# Patient Record
Sex: Male | Born: 1970 | Race: White | Hispanic: No | Marital: Married | Smoking: Former smoker
Health system: Southern US, Community
[De-identification: ages and names within clinical notes are randomized; demographics above are authoritative.]

## PROBLEM LIST (undated history)

## (undated) DIAGNOSIS — M722 Plantar fascial fibromatosis: Secondary | ICD-10-CM

## (undated) DIAGNOSIS — Z889 Allergy status to unspecified drugs, medicaments and biological substances status: Secondary | ICD-10-CM

## (undated) DIAGNOSIS — K5790 Diverticulosis of intestine, part unspecified, without perforation or abscess without bleeding: Secondary | ICD-10-CM

---

## 2002-07-13 ENCOUNTER — Emergency Department (HOSPITAL_COMMUNITY): Admission: EM | Admit: 2002-07-13 | Discharge: 2002-07-13 | Payer: Self-pay | Admitting: Emergency Medicine

## 2002-07-13 ENCOUNTER — Encounter: Payer: Self-pay | Admitting: Emergency Medicine

## 2014-12-30 ENCOUNTER — Other Ambulatory Visit: Payer: Self-pay | Admitting: Gastroenterology

## 2014-12-30 DIAGNOSIS — R1011 Right upper quadrant pain: Secondary | ICD-10-CM

## 2015-01-06 ENCOUNTER — Ambulatory Visit
Admission: RE | Admit: 2015-01-06 | Discharge: 2015-01-06 | Disposition: A | Discharge status: Home / Self Care | Payer: BLUE CROSS/BLUE SHIELD | Source: Ambulatory Visit | Attending: Gastroenterology | Admitting: Gastroenterology

## 2015-01-06 DIAGNOSIS — R1011 Right upper quadrant pain: Secondary | ICD-10-CM

## 2015-07-20 ENCOUNTER — Other Ambulatory Visit: Payer: Self-pay | Admitting: Gastroenterology

## 2015-07-20 ENCOUNTER — Other Ambulatory Visit (HOSPITAL_COMMUNITY): Payer: Self-pay | Admitting: Gastroenterology

## 2015-07-20 DIAGNOSIS — K5780 Diverticulitis of intestine, part unspecified, with perforation and abscess without bleeding: Secondary | ICD-10-CM

## 2015-07-20 DIAGNOSIS — R1032 Left lower quadrant pain: Secondary | ICD-10-CM

## 2015-07-22 ENCOUNTER — Ambulatory Visit (HOSPITAL_COMMUNITY): Payer: BLUE CROSS/BLUE SHIELD

## 2015-07-24 ENCOUNTER — Other Ambulatory Visit: Payer: BLUE CROSS/BLUE SHIELD

## 2016-05-03 ENCOUNTER — Observation Stay (HOSPITAL_COMMUNITY): Payer: BLUE CROSS/BLUE SHIELD | Admitting: Anesthesiology

## 2016-05-03 ENCOUNTER — Observation Stay (HOSPITAL_COMMUNITY)
Admission: EM | Admit: 2016-05-03 | Discharge: 2016-05-04 | Disposition: A | Discharge status: Home / Self Care | Payer: BLUE CROSS/BLUE SHIELD | Attending: General Surgery | Admitting: General Surgery

## 2016-05-03 ENCOUNTER — Emergency Department (HOSPITAL_COMMUNITY): Payer: BLUE CROSS/BLUE SHIELD

## 2016-05-03 ENCOUNTER — Encounter (HOSPITAL_COMMUNITY): Payer: Self-pay | Admitting: Emergency Medicine

## 2016-05-03 ENCOUNTER — Encounter (HOSPITAL_COMMUNITY)
Admission: EM | Disposition: A | Discharge status: Home / Self Care | Payer: Self-pay | Source: Home / Self Care | Attending: Emergency Medicine

## 2016-05-03 DIAGNOSIS — K3580 Unspecified acute appendicitis: Secondary | ICD-10-CM

## 2016-05-03 DIAGNOSIS — Z791 Long term (current) use of non-steroidal anti-inflammatories (NSAID): Secondary | ICD-10-CM | POA: Diagnosis not present

## 2016-05-03 DIAGNOSIS — Z79899 Other long term (current) drug therapy: Secondary | ICD-10-CM | POA: Insufficient documentation

## 2016-05-03 DIAGNOSIS — R109 Unspecified abdominal pain: Secondary | ICD-10-CM | POA: Diagnosis present

## 2016-05-03 DIAGNOSIS — K353 Acute appendicitis with localized peritonitis: Principal | ICD-10-CM | POA: Insufficient documentation

## 2016-05-03 HISTORY — DX: Plantar fascial fibromatosis: M72.2

## 2016-05-03 HISTORY — DX: Diverticulosis of intestine, part unspecified, without perforation or abscess without bleeding: K57.90

## 2016-05-03 HISTORY — DX: Allergy status to unspecified drugs, medicaments and biological substances: Z88.9

## 2016-05-03 HISTORY — PX: LAPAROSCOPIC APPENDECTOMY: SHX408

## 2016-05-03 LAB — LIPASE, BLOOD: LIPASE: 26 U/L (ref 11–51)

## 2016-05-03 LAB — URINALYSIS, ROUTINE W REFLEX MICROSCOPIC
BILIRUBIN URINE: NEGATIVE
Glucose, UA: NEGATIVE mg/dL
Hgb urine dipstick: NEGATIVE
Ketones, ur: NEGATIVE mg/dL
Leukocytes, UA: NEGATIVE
NITRITE: NEGATIVE
PH: 7 (ref 5.0–8.0)
Protein, ur: NEGATIVE mg/dL
SPECIFIC GRAVITY, URINE: 1.019 (ref 1.005–1.030)

## 2016-05-03 LAB — COMPREHENSIVE METABOLIC PANEL
ALT: 36 U/L (ref 17–63)
ANION GAP: 10 (ref 5–15)
AST: 25 U/L (ref 15–41)
Albumin: 4.3 g/dL (ref 3.5–5.0)
Alkaline Phosphatase: 34 U/L — ABNORMAL LOW (ref 38–126)
BUN: 9 mg/dL (ref 6–20)
CHLORIDE: 104 mmol/L (ref 101–111)
CO2: 23 mmol/L (ref 22–32)
Calcium: 9.2 mg/dL (ref 8.9–10.3)
Creatinine, Ser: 1.23 mg/dL (ref 0.61–1.24)
GFR calc Af Amer: >60 mL/min (ref >60)
GFR calc non Af Amer: >60 mL/min (ref >60)
GLUCOSE: 119 mg/dL — AB (ref 65–99)
POTASSIUM: 4.1 mmol/L (ref 3.5–5.1)
SODIUM: 137 mmol/L (ref 135–145)
TOTAL PROTEIN: 6.9 g/dL (ref 6.5–8.1)
Total Bilirubin: 1.2 mg/dL (ref 0.3–1.2)

## 2016-05-03 LAB — CBC
HEMATOCRIT: 47.6 % (ref 39.0–52.0)
HEMOGLOBIN: 17.5 g/dL — AB (ref 13.0–17.0)
MCH: 33 pg (ref 26.0–34.0)
MCHC: 36.8 g/dL — AB (ref 30.0–36.0)
MCV: 89.8 fL (ref 78.0–100.0)
Platelets: 159 10*3/uL (ref 150–400)
RBC: 5.3 MIL/uL (ref 4.22–5.81)
RDW: 13 % (ref 11.5–15.5)
WBC: 9.5 10*3/uL (ref 4.0–10.5)

## 2016-05-03 SURGERY — APPENDECTOMY, LAPAROSCOPIC
Anesthesia: General | Site: Abdomen

## 2016-05-03 MED ORDER — IOPAMIDOL (ISOVUE-300) INJECTION 61%
INTRAVENOUS | Status: AC
  Administered 2016-05-03: 100 mL
  Filled 2016-05-03: qty 100

## 2016-05-03 MED ORDER — ONDANSETRON HCL 4 MG/2ML IJ SOLN
4.0000 mg | Freq: Four times a day (QID) | INTRAMUSCULAR | Status: DC | PRN
  Administered 2016-05-03: 4 mg via INTRAVENOUS

## 2016-05-03 MED ORDER — SENNA 8.6 MG PO TABS
1.0000 | ORAL_TABLET | Freq: Two times a day (BID) | ORAL | Status: DC
  Administered 2016-05-03: 8.6 mg via ORAL
  Filled 2016-05-03: qty 1

## 2016-05-03 MED ORDER — 0.9 % SODIUM CHLORIDE (POUR BTL) OPTIME
TOPICAL | Status: DC | PRN
  Administered 2016-05-03: 1000 mL

## 2016-05-03 MED ORDER — KCL IN DEXTROSE-NACL 20-5-0.45 MEQ/L-%-% IV SOLN
INTRAVENOUS | Status: DC
  Administered 2016-05-03: 12:00:00 via INTRAVENOUS
  Filled 2016-05-03: qty 1000

## 2016-05-03 MED ORDER — DEXTROSE 5 % IV SOLN
2.0000 g | INTRAVENOUS | Status: DC
  Administered 2016-05-04: 2 g via INTRAVENOUS
  Filled 2016-05-03: qty 2

## 2016-05-03 MED ORDER — DEXAMETHASONE SODIUM PHOSPHATE 4 MG/ML IJ SOLN
INTRAMUSCULAR | Status: DC | PRN
  Administered 2016-05-03: 10 mg via INTRAVENOUS

## 2016-05-03 MED ORDER — CLONAZEPAM 0.5 MG PO TABS: 0.5000 mg | ORAL_TABLET | Freq: Two times a day (BID) | ORAL | Status: DC | PRN

## 2016-05-03 MED ORDER — METRONIDAZOLE IN NACL 5-0.79 MG/ML-% IV SOLN
500.0000 mg | Freq: Once | INTRAVENOUS | Status: DC
  Administered 2016-05-03: 500 mg via INTRAVENOUS
  Filled 2016-05-03: qty 100

## 2016-05-03 MED ORDER — LIDOCAINE HCL (CARDIAC) 20 MG/ML IV SOLN
INTRAVENOUS | Status: DC | PRN
  Administered 2016-05-03: 100 mg via INTRAVENOUS

## 2016-05-03 MED ORDER — OXYCODONE HCL 5 MG PO TABS
5.0000 mg | ORAL_TABLET | ORAL | Status: DC | PRN
  Administered 2016-05-03 – 2016-05-04 (×3): 5 mg via ORAL
  Filled 2016-05-03 (×3): qty 1

## 2016-05-03 MED ORDER — FENTANYL CITRATE (PF) 100 MCG/2ML IJ SOLN
25.0000 ug | INTRAMUSCULAR | Status: DC | PRN
  Administered 2016-05-03 (×2): 50 ug via INTRAVENOUS

## 2016-05-03 MED ORDER — FENTANYL CITRATE (PF) 100 MCG/2ML IJ SOLN
INTRAMUSCULAR | Status: AC
  Filled 2016-05-03: qty 2

## 2016-05-03 MED ORDER — SUGAMMADEX SODIUM 200 MG/2ML IV SOLN
INTRAVENOUS | Status: DC | PRN
  Administered 2016-05-03: 200 mg via INTRAVENOUS

## 2016-05-03 MED ORDER — DOCUSATE SODIUM 100 MG PO CAPS
100.0000 mg | ORAL_CAPSULE | Freq: Two times a day (BID) | ORAL | Status: DC
  Administered 2016-05-03: 100 mg via ORAL
  Filled 2016-05-03: qty 1

## 2016-05-03 MED ORDER — ACETAMINOPHEN 325 MG PO TABS: 650.0000 mg | ORAL_TABLET | Freq: Four times a day (QID) | ORAL | Status: DC | PRN

## 2016-05-03 MED ORDER — METRONIDAZOLE IN NACL 5-0.79 MG/ML-% IV SOLN
500.0000 mg | Freq: Once | INTRAVENOUS | Status: AC
  Administered 2016-05-03: 500 mg via INTRAVENOUS
  Filled 2016-05-03: qty 100

## 2016-05-03 MED ORDER — MORPHINE SULFATE (PF) 4 MG/ML IV SOLN
4.0000 mg | Freq: Once | INTRAVENOUS | Status: AC
  Administered 2016-05-03: 4 mg via INTRAVENOUS
  Filled 2016-05-03: qty 1

## 2016-05-03 MED ORDER — FENTANYL CITRATE (PF) 100 MCG/2ML IJ SOLN
INTRAMUSCULAR | Status: DC | PRN
  Administered 2016-05-03 (×2): 50 ug via INTRAVENOUS
  Administered 2016-05-03: 100 ug via INTRAVENOUS

## 2016-05-03 MED ORDER — BUPIVACAINE HCL (PF) 0.25 % IJ SOLN
INTRAMUSCULAR | Status: AC
  Filled 2016-05-03: qty 30

## 2016-05-03 MED ORDER — ONDANSETRON 4 MG PO TBDP: 4.0000 mg | ORAL_TABLET | Freq: Four times a day (QID) | ORAL | Status: DC | PRN

## 2016-05-03 MED ORDER — METRONIDAZOLE IN NACL 5-0.79 MG/ML-% IV SOLN
500.0000 mg | Freq: Three times a day (TID) | INTRAVENOUS | Status: DC
  Administered 2016-05-03 – 2016-05-04 (×2): 500 mg via INTRAVENOUS
  Filled 2016-05-03 (×4): qty 100

## 2016-05-03 MED ORDER — HYDROMORPHONE HCL 1 MG/ML IJ SOLN
1.0000 mg | Freq: Once | INTRAMUSCULAR | Status: AC
  Administered 2016-05-03: 1 mg via INTRAVENOUS
  Filled 2016-05-03: qty 1

## 2016-05-03 MED ORDER — KCL IN DEXTROSE-NACL 20-5-0.45 MEQ/L-%-% IV SOLN
INTRAVENOUS | Status: DC
  Administered 2016-05-03 – 2016-05-04 (×2): via INTRAVENOUS
  Filled 2016-05-03: qty 1000

## 2016-05-03 MED ORDER — DEXTROSE 5 % IV SOLN
2.0000 g | Freq: Once | INTRAVENOUS | Status: AC
  Administered 2016-05-03: 2 g via INTRAVENOUS
  Filled 2016-05-03: qty 2

## 2016-05-03 MED ORDER — KETOROLAC TROMETHAMINE 30 MG/ML IJ SOLN
INTRAMUSCULAR | Status: AC
  Filled 2016-05-03: qty 2

## 2016-05-03 MED ORDER — SODIUM CHLORIDE 0.9 % IR SOLN
Status: DC | PRN
  Administered 2016-05-03: 1000 mL

## 2016-05-03 MED ORDER — ONDANSETRON HCL 4 MG/2ML IJ SOLN
INTRAMUSCULAR | Status: AC
  Filled 2016-05-03: qty 2

## 2016-05-03 MED ORDER — PROPOFOL 10 MG/ML IV BOLUS
INTRAVENOUS | Status: DC | PRN
  Administered 2016-05-03: 200 mg via INTRAVENOUS

## 2016-05-03 MED ORDER — FENTANYL CITRATE (PF) 250 MCG/5ML IJ SOLN
INTRAMUSCULAR | Status: AC
  Filled 2016-05-03: qty 5

## 2016-05-03 MED ORDER — SUCCINYLCHOLINE CHLORIDE 20 MG/ML IJ SOLN
INTRAMUSCULAR | Status: DC | PRN
  Administered 2016-05-03: 120 mg via INTRAVENOUS

## 2016-05-03 MED ORDER — ACETAMINOPHEN 500 MG PO TABS: 500.0000 mg | ORAL_TABLET | Freq: Four times a day (QID) | ORAL | Status: DC | PRN

## 2016-05-03 MED ORDER — MIDAZOLAM HCL 5 MG/5ML IJ SOLN
INTRAMUSCULAR | Status: DC | PRN
  Administered 2016-05-03: 2 mg via INTRAVENOUS

## 2016-05-03 MED ORDER — BUPIVACAINE HCL (PF) 0.25 % IJ SOLN
INTRAMUSCULAR | Status: DC | PRN
  Administered 2016-05-03: 10 mL

## 2016-05-03 MED ORDER — ONDANSETRON HCL 4 MG/2ML IJ SOLN
4.0000 mg | Freq: Once | INTRAMUSCULAR | Status: AC
  Administered 2016-05-03: 4 mg via INTRAVENOUS
  Filled 2016-05-03: qty 2

## 2016-05-03 MED ORDER — HYDROMORPHONE HCL 1 MG/ML IJ SOLN
0.5000 mg | INTRAMUSCULAR | Status: DC | PRN
  Administered 2016-05-03 (×3): 0.5 mg via INTRAVENOUS
  Filled 2016-05-03 (×3): qty 1

## 2016-05-03 MED ORDER — HYDROMORPHONE HCL 1 MG/ML IJ SOLN
0.5000 mg | INTRAMUSCULAR | Status: DC | PRN
  Administered 2016-05-03 – 2016-05-04 (×2): 0.5 mg via INTRAVENOUS
  Filled 2016-05-03 (×2): qty 1

## 2016-05-03 MED ORDER — NEOSTIGMINE METHYLSULFATE 5 MG/5ML IV SOSY
PREFILLED_SYRINGE | INTRAVENOUS | Status: AC
  Filled 2016-05-03: qty 5

## 2016-05-03 MED ORDER — PROPOFOL 10 MG/ML IV BOLUS
INTRAVENOUS | Status: AC
  Filled 2016-05-03: qty 20

## 2016-05-03 MED ORDER — ROCURONIUM BROMIDE 100 MG/10ML IV SOLN
INTRAVENOUS | Status: DC | PRN
Start: 2016-05-03 — End: 2016-05-03
  Administered 2016-05-03: 40 mg via INTRAVENOUS

## 2016-05-03 MED ORDER — LACTATED RINGERS IV SOLN
INTRAVENOUS | Status: DC
  Administered 2016-05-03: 17:00:00 via INTRAVENOUS

## 2016-05-03 MED ORDER — MIDAZOLAM HCL 2 MG/2ML IJ SOLN
INTRAMUSCULAR | Status: AC
  Filled 2016-05-03: qty 2

## 2016-05-03 SURGICAL SUPPLY — 48 items
ADH SKN CLS APL DERMABOND .7 (GAUZE/BANDAGES/DRESSINGS) ×1
ADH SKN CLS LQ APL DERMABOND (GAUZE/BANDAGES/DRESSINGS) ×1
APPLIER CLIP ROT 10 11.4 M/L (STAPLE)
APR CLP MED LRG 11.4X10 (STAPLE)
BAG SPEC RTRVL LRG 6X4 10 (ENDOMECHANICALS) ×1
BLADE CLIPPER SURG (BLADE) IMPLANT
CANISTER SUCT 3000ML PPV (MISCELLANEOUS) ×3 IMPLANT
CHLORAPREP W/TINT 26ML (MISCELLANEOUS) ×3 IMPLANT
CLIP APPLIE ROT 10 11.4 M/L (STAPLE) IMPLANT
COVER SURGICAL LIGHT HANDLE (MISCELLANEOUS) ×3 IMPLANT
CUTTER FLEX LINEAR 45M (STAPLE) ×3 IMPLANT
DERMABOND ADHESIVE PROPEN (GAUZE/BANDAGES/DRESSINGS) ×2
DERMABOND ADVANCED (GAUZE/BANDAGES/DRESSINGS) ×2
DERMABOND ADVANCED .7 DNX12 (GAUZE/BANDAGES/DRESSINGS) ×1 IMPLANT
DERMABOND ADVANCED .7 DNX6 (GAUZE/BANDAGES/DRESSINGS) IMPLANT
ELECT REM PT RETURN 9FT ADLT (ELECTROSURGICAL) ×3
ELECTRODE REM PT RTRN 9FT ADLT (ELECTROSURGICAL) ×1 IMPLANT
GLOVE BIO SURGEON STRL SZ8 (GLOVE) ×3 IMPLANT
GLOVE BIOGEL PI IND STRL 8 (GLOVE) ×1 IMPLANT
GLOVE BIOGEL PI INDICATOR 8 (GLOVE) ×2
GOWN STRL REUS W/ TWL LRG LVL3 (GOWN DISPOSABLE) ×2 IMPLANT
GOWN STRL REUS W/ TWL XL LVL3 (GOWN DISPOSABLE) ×1 IMPLANT
GOWN STRL REUS W/TWL LRG LVL3 (GOWN DISPOSABLE) ×12
GOWN STRL REUS W/TWL XL LVL3 (GOWN DISPOSABLE)
KIT BASIN OR (CUSTOM PROCEDURE TRAY) ×3 IMPLANT
KIT ROOM TURNOVER OR (KITS) ×3 IMPLANT
NEEDLE 22X1 1/2 (OR ONLY) (NEEDLE) ×3 IMPLANT
NS IRRIG 1000ML POUR BTL (IV SOLUTION) ×3 IMPLANT
PAD ARMBOARD 7.5X6 YLW CONV (MISCELLANEOUS) ×6 IMPLANT
POUCH SPECIMEN RETRIEVAL 10MM (ENDOMECHANICALS) ×3 IMPLANT
RELOAD 45 VASCULAR/THIN (ENDOMECHANICALS) ×3 IMPLANT
RELOAD STAPLE 45 2.5 WHT GRN (ENDOMECHANICALS) IMPLANT
RELOAD STAPLE 45 3.5 BLU ETS (ENDOMECHANICALS) IMPLANT
RELOAD STAPLE TA45 3.5 REG BLU (ENDOMECHANICALS) ×3 IMPLANT
SCISSORS ENDO CVD 5DCS (MISCELLANEOUS) ×2 IMPLANT
SCISSORS LAP 5X35 DISP (ENDOMECHANICALS) IMPLANT
SET IRRIG TUBING LAPAROSCOPIC (IRRIGATION / IRRIGATOR) ×3 IMPLANT
SHEARS HARMONIC ACE PLUS 36CM (ENDOMECHANICALS) ×3 IMPLANT
SPECIMEN JAR SMALL (MISCELLANEOUS) ×3 IMPLANT
SUT VIC AB 4-0 PS2 27 (SUTURE) ×3 IMPLANT
TOWEL OR 17X24 6PK STRL BLUE (TOWEL DISPOSABLE) ×3 IMPLANT
TOWEL OR 17X26 10 PK STRL BLUE (TOWEL DISPOSABLE) ×3 IMPLANT
TRAY FOLEY CATH SILVER 16FR (SET/KITS/TRAYS/PACK) ×3 IMPLANT
TRAY LAPAROSCOPIC MC (CUSTOM PROCEDURE TRAY) ×3 IMPLANT
TROCAR XCEL 12X100 BLDLESS (ENDOMECHANICALS) ×3 IMPLANT
TROCAR XCEL BLUNT TIP 100MML (ENDOMECHANICALS) ×3 IMPLANT
TROCAR XCEL NON-BLD 5MMX100MML (ENDOMECHANICALS) ×3 IMPLANT
TUBING INSUFFLATION (TUBING) ×3 IMPLANT

## 2016-05-03 NOTE — Anesthesia Procedure Notes (Signed)
Procedure Name: Intubation Date/Time: 05/03/2016 5:46 PM Performed by: Daiva Eves Pre-anesthesia Checklist: Patient identified, Emergency Drugs available, Suction available and Patient being monitored Patient Re-evaluated:Patient Re-evaluated prior to inductionOxygen Delivery Method: Circle system utilized Preoxygenation: Pre-oxygenation with 100% oxygen Intubation Type: IV induction Ventilation: Mask ventilation without difficulty Laryngoscope Size: Glidescope and 3 Grade View: Grade I Tube type: Oral Tube size: 7.5 mm Number of attempts: 1 Placement Confirmation: ETT inserted through vocal cords under direct vision,  positive ETCO2 and breath sounds checked- equal and bilateral Secured at: 23 cm Tube secured with: Tape Dental Injury: Teeth and Oropharynx as per pre-operative assessment

## 2016-05-03 NOTE — ED Notes (Signed)
Pt ambulated to restroom from room, tolerated well. 

## 2016-05-03 NOTE — Progress Notes (Signed)
S: Feels OK.   Vitals, labs, intake/output, and orders reviewed at this time.  Gen: A&Ox3, no distress  H&N: EOMI, atraumatic, neck supple Chest: unlabored respirations, RRR Abd: RLQ tender, soft, nondistended, no surgical scars Ext: warm, no edema Neuro: grossly normal  Lines/tubes/drains: PIV  A/P:  46yo male with acute appendicitis. To OR this evening for laparoscopic appendectomy. I discussed the procedure with him and his family including risks of bleeding, infection, pain, scarring, intraabdominal injury, conversion to open surgery, staple line leak or abscess. He and his family asked appropriate questions all of which were answered.    Phylliss Blakes, MD [facility], [place] Pager [phone]

## 2016-05-03 NOTE — ED Notes (Signed)
Addressed delay with pt placement and addressed

## 2016-05-03 NOTE — ED Provider Notes (Signed)
[facility]-EMERGENCY DEPT Provider Note   CSN: [id number] Arrival date & time: 05/03/16  0454     History   Chief Complaint Chief Complaint  Patient presents with  . Abdominal Pain    HPI John Dunlap is a 46 y.o. male.  The history is provided by the patient and medical records.  Abdominal Pain      46 y.o. M with hx of Diverticulosis, presenting to the ED for lower abdominal pain. Reports this began last Thursday (5 days ago) and has been worsening since then.  Pain lower abdominal in location, no radiation.  Reports some nausea but denies vomiting or diarrhea.  BM's have been normal.  No urinary symptoms.  No fever, chills, sweats.  Patient does have history of diverticulitis and reports this feels similar. States his last bout with this was a few months ago. States he was only getting 1-2 episodes of diverticulitis year, however has been occurring more frequently recently. States he has been following the diet closely, no corn, nuts, popcorn, etc.  No prior abdominal surgeries.  Past Medical History:  Diagnosis Date  . Diverticulosis     There are no active problems to display for this patient.   History reviewed. No pertinent surgical history.     Home Medications    Prior to Admission medications   Not on File    Family History History reviewed. No pertinent family history.  Social History Social History  Substance Use Topics  . Smoking status: Never Smoker  . Smokeless tobacco: Never Used  . Alcohol use Yes     Comment: 3-4x week     Allergies   Patient has no known allergies.   Review of Systems Review of Systems  Gastrointestinal: Positive for abdominal pain.  All other systems reviewed and are negative.    Physical Exam Updated Vital Signs BP (!) 160/96 (BP Location: Right Arm)   Pulse 71   Temp 97.9 F (36.6 C) (Oral)   Resp 18   Ht  (1.778 m)   Wt 88.5 kg   SpO2 96%   BMI 27.98 kg/m   Physical Exam  Constitutional: He  is oriented to person, place, and time. He appears well-developed and well-nourished.  HENT:  Head: Normocephalic and atraumatic.  Mouth/Throat: Oropharynx is clear and moist.  Eyes: Conjunctivae and EOM are normal. Pupils are equal, round, and reactive to light.  Neck: Normal range of motion.  Cardiovascular: Normal rate, regular rhythm and normal heart sounds.   Pulmonary/Chest: Effort normal and breath sounds normal. No respiratory distress. He has no wheezes.  Abdominal: Soft. Bowel sounds are normal. There is tenderness in the periumbilical area.    Musculoskeletal: Normal range of motion.  Neurological: He is alert and oriented to person, place, and time.  Skin: Skin is warm and dry.  Psychiatric: He has a normal mood and affect.  Nursing note and vitals reviewed.    ED Treatments / Results  Labs (all labs ordered are listed, but only abnormal results are displayed) Labs Reviewed  COMPREHENSIVE METABOLIC PANEL - Abnormal; Notable for the following:       Result Value   Glucose, Bld 119 (*)    Alkaline Phosphatase 34 (*)    All other components within normal limits  CBC - Abnormal; Notable for the following:    Hemoglobin 17.5 (*)    MCHC 36.8 (*)    All other components within normal limits  URINALYSIS, ROUTINE W REFLEX MICROSCOPIC - Abnormal; Notable  for the following:    Color, Urine STRAW (*)    All other components within normal limits  LIPASE, BLOOD    EKG  EKG Interpretation None       Radiology Ct Abdomen Pelvis W Contrast  Result Date: 05/03/2016 CLINICAL DATA:  Lower abdominal pain.  History of diverticulitis. EXAM: CT ABDOMEN AND PELVIS WITH CONTRAST TECHNIQUE: Multidetector CT imaging of the abdomen and pelvis was performed using the standard protocol following bolus administration of intravenous contrast. CONTRAST:  ISOVUE-300 IOPAMIDOL (ISOVUE-300) INJECTION 61% COMPARISON:  None. FINDINGS: Lower chest: Mild dependent changes in the lung bases.  Hepatobiliary: Diffuse fatty infiltration of the liver. No focal liver lesions. Gallbladder and bile ducts are unremarkable. Pancreas: Unremarkable. No pancreatic ductal dilatation or surrounding inflammatory changes. Spleen: Normal in size without focal abnormality. Adrenals/Urinary Tract: Adrenal glands are unremarkable. Kidneys are normal, without renal calculi, focal lesion, or hydronephrosis. Bladder is unremarkable. Stomach/Bowel: Mildly distended retrocecal appendix with periappendiceal infiltration likely representing early acute appendicitis. No abscess. Multiple right colonic diverticula are present and right-sided diverticulitis with secondary inflammation of the appendix is not entirely excluded. Scattered diverticula throughout the colon. No colonic distention. Stomach and small bowel are decompressed. Vascular/Lymphatic: Aortic atherosclerosis. No enlarged abdominal or pelvic lymph nodes. Reproductive: Prostate is unremarkable. Other: No abdominal wall hernia or abnormality. No abdominopelvic ascites. Musculoskeletal: No destructive bone lesions. IMPRESSION: Inflammatory changes in the right lower quadrant most likely representing acute appendicitis. However, multiple right colonic diverticula are present and right colonic diverticulitis is not entirely excluded. No abscess. No evidence of bowel obstruction. Diffuse fatty infiltration of the liver. Electronically Signed   By: Burman Nieves M.D.   On: 05/03/2016 06:36    Procedures Procedures (including critical care time)  Medications Ordered in ED Medications  metroNIDAZOLE (FLAGYL) IVPB 500 mg (not administered)  cefTRIAXone (ROCEPHIN) 2 g in dextrose 5 % 50 mL IVPB (not administered)  HYDROmorphone (DILAUDID) injection 1 mg (not administered)  morphine 4 MG/ML injection 4 mg (4 mg Intravenous Given 05/03/16 0535)  ondansetron (ZOFRAN) injection 4 mg (4 mg Intravenous Given 05/03/16 0535)  iopamidol (ISOVUE-300) 61 % injection (100 mLs   Contrast Given 05/03/16 0603)     Initial Impression / Assessment and Plan / ED Course  I have reviewed the triage vital signs and the nursing notes.  Pertinent labs & imaging results that were available during my care of the patient were reviewed by me and considered in my medical decision making (see chart for details).  46 year old male here with lower abdominal pain. Has history of diverticulitis, reports current symptoms are similar.  He is afebrile and nontoxic. She does have tenderness in the periumbilical and lower abdominal region.  Labwork is overall reassuring, white count is normal. UA without signs of infection.  CT scan obtained revealing inflammatory changes in the right lower quadrant concerning for acute appendicitis. There is questionable right colonic diverticulitis as well. Patient was started on Rocephin and flagyl. Discussed with general surgery, Dr. Dwain Sarna-- morning team will evaluate and admit.  Final Clinical Impressions(s) / ED Diagnoses   Final diagnoses:  Acute appendicitis, unspecified acute appendicitis type    New Prescriptions New Prescriptions   No medications on file     Garlon Hatchet, PA-C 05/03/16 0704    Layla Maw Ward, DO 05/03/16 1610

## 2016-05-03 NOTE — Anesthesia Preprocedure Evaluation (Signed)
Anesthesia Evaluation  Patient identified by MRN, date of birth, ID band Patient awake    Reviewed: Allergy & Precautions, NPO status , Patient's Chart, lab work & pertinent test results  History of Anesthesia Complications (+) Emergence Delirium  Airway Mallampati: II  TM Distance: >3 FB     Dental   Pulmonary    breath sounds clear to auscultation       Cardiovascular negative cardio ROS   Rhythm:Regular Rate:Normal     Neuro/Psych    GI/Hepatic Neg liver ROS, History noted. CG   Endo/Other  negative endocrine ROS  Renal/GU negative Renal ROS     Musculoskeletal   Abdominal   Peds  Hematology   Anesthesia Other Findings   Reproductive/Obstetrics                             Anesthesia Physical Anesthesia Plan  ASA: III  Anesthesia Plan: General   Post-op Pain Management:    Induction: Intravenous  Airway Management Planned: Oral ETT  Additional Equipment:   Intra-op Plan:   Post-operative Plan: Extubation in OR  Informed Consent: I have reviewed the patients History and Physical, chart, labs and discussed the procedure including the risks, benefits and alternatives for the proposed anesthesia with the patient or authorized representative who has indicated his/her understanding and acceptance.   Dental advisory given  Plan Discussed with: CRNA and Anesthesiologist  Anesthesia Plan Comments:         Anesthesia Quick Evaluation

## 2016-05-03 NOTE — Op Note (Signed)
Operative Report  John Dunlap 46 y.o. male  [id number]  [id number]  05/03/2016  Surgeon: Berna Bue   Assistant: OR staff  Procedure performed: Laparoscopic Appendectomy  Preop diagnosis: Acute appendicitis - with localized peritonitis   Post-op diagnosis/intraop findings: Acute appendicitis - with localized peritonitis   Specimens: appendix  EBL: 30cc  Complications: none  Description of procedure: After obtaining informed consent the patient was brought to the operating room. Antibiotics and subcutaneous heparin were administered. SCD's were applied. General endotracheal anesthesia was initiated and a formal time-out was performed. The abdomen was prepped and draped in the usual sterile fashion and the abdomen was entered using an infraumbilical Veress needle and insufflated to 15 mmHg. A 5 mm trocar and camera were then introduced, the abdomen was inspected and there is no evidence of injury from our entry. A suprapubic 5 mm trocar and a left lower quadrant 12 mm trocar were introduced under direct visualization following infiltration with local. The patient was then placed in steep Trendelenburg and rotated to the left and the small bowel was reflected cephalad. The appendix was visualized: there was a small amount of murky fluid adjacent to the appendix but no perforation or frank purulence was present. The appendix was retrocecal. A combination of blunt dissection and hook electrocautery were used to free it of its retroperitoneal attachments. Great care was taken to ensure no injury to surrounding retroperitoneal structures, cecum or terminal ileum. A window was created at the base of the appendix and a blue load linear cutting stapler was used to transect the appendix from the cecum. A white load linear cutting stapler was then used to transect the appendiceal mesentery. Hemostasis was ensured, requiring some cautery on the mesenteric staple line. The appendix was  placed in an Endo Catch bag and removed through our 12 mm trocar. The abdomen was inspected and irrigated, all effluent was clear, and hemostasis again confirmed. The 12mm trocar site in the left lower quadrant was closed with a 0 vicryl in the fascia under direct visualization using a PMI device. The abdomen was desufflated and all trocars removed. The skin incisions were closed with running subcuticular vicryl and Dermabond. The patient was awakened, extubated and transported to the recovery room in stable condition.   All counts were correct at the completion of the case.

## 2016-05-03 NOTE — H&P (Signed)
[facility] Admission Note  John Dunlap [birth year]  [id number].    Requesting MD: Leonides Schanz, MD Chief Complaint/Reason for Consult: lower abdominal pain  HPI:  John Dunlap is a 46 y.o. Male with a PMH diverticulitis and plantar fasciitis who presented to [facility] with 5 days of lower abdominal pain. The pain started in his right lower quadrant on Thursday and waxed and waned until last night 4/16 when it became more severe and constant, bringing him to the ED around 0400 today. Pain is described as sharp and non-radiating. Worse with certain movements. Relieved by IV pain medication. Associated symptoms include nausea. He was diagnosed with diverticula of his right colon/cecum in 2006 and reports multiple bouts of diverticulitis since that time. Diverticulitis pain described as intermittent, occasionally associated with diarrhea, and usually relieved with clear liquid diet and PO cipro/flagyl. He states that when he gets "Diverticulitis pain" it typically goes away 4 hours after he eats a meal. Comparatively, the pain he desceribes the past 12-16 hours has been constant and was not relieved with food/liquids or bowel movements. He denies fever, constipation, diarrhea, or changes in bowel habits. He sees John Dunlap of [facility] and received a colonoscopy 2 years ago significant for diverticula and 2 benign polyps that were removed. He states John Dunlap has tested him for Crohn's disease and "a lot of other bowel diseases" and the tests have all been negative. Denies a history of small bowel biopsy. Denies a known personal or family history of colon cancer. No personal history of MI, CVA, DM, or use of blood thinning medications. NKDA.  ED workup: afebrile, WBC, lipase, CMET, and UA are WNL  CT abd/pelvis: distended retrocecal appendix with periappendiceal inflammation and multiple right-sided diverticula are present - right sided diverticulitis with secondary inflammation of appendix is not  entirely excluded.  For comparison, CT scan 06/2002 significant for pericecal inflammation and diverticula of the right colon, suspicious for cecal diverticulitis. Appendix visible and filled with contrast  ROS: Review of Systems  Constitutional: Negative for chills, fever and weight loss.  Gastrointestinal: Positive for abdominal pain, constipation and nausea. Negative for blood in stool, diarrhea, melena and vomiting.  Genitourinary: Negative.   Skin: Negative.   All other systems reviewed and are negative.   History reviewed. No pertinent family history.  Past Medical History:  Diagnosis Date  . Diverticulosis     History reviewed. No pertinent surgical history.  Social History:  reports that he has never smoked. He has never used smokeless tobacco. He reports that he drinks alcohol. He reports that he does not use drugs.  Allergies: No Known Allergies   (Not in a hospital admission)  Blood pressure (!) 132/94, pulse 76, temperature 97.9 F (36.6 C), temperature source Oral, resp. rate 18, height _0  (1.778 m), weight 88.5 kg (195 lb), SpO2 92 %. Physical Exam: Physical Exam  Constitutional: He is oriented to person, place, and time. He appears well-developed and well-nourished. No distress.  HENT:  Head: Normocephalic and atraumatic.  Right Ear: External ear normal.  Left Ear: External ear normal.  Mouth/Throat: Oropharynx is clear and moist.  Eyes: EOM are normal. Pupils are equal, round, and reactive to light. Right eye exhibits no discharge. Left eye exhibits no discharge. No scleral icterus.  Neck: Normal range of motion. Neck supple. No tracheal deviation present. No thyromegaly present.  Cardiovascular: Normal rate, regular rhythm, normal heart sounds and intact distal pulses.   Pulmonary/Chest: He is in respiratory distress. He  has wheezes. He has rales. He exhibits tenderness.  Abdominal: Soft. He exhibits no distension and no mass. There is tenderness (focal  RLQ/right lower flank tenderness to palpation.  ). There is guarding. There is no rebound. No hernia.  Musculoskeletal: Normal range of motion. He exhibits no edema, tenderness or deformity.  Neurological: He is alert and oriented to person, place, and time. No cranial nerve deficit.  Skin: Skin is warm and dry. No rash noted. He is not diaphoretic. No erythema.  Psychiatric: He has a normal mood and affect. His behavior is normal. Thought content normal.    Results for orders placed or performed during the hospital encounter of 05/03/16 (from the past 48 hour(s))  Lipase, blood     Status: None   Collection Time: 05/03/16  5:06 AM  Result Value Ref Range   Lipase 26 11 - 51 U/L  Comprehensive metabolic panel     Status: Abnormal   Collection Time: 05/03/16  5:06 AM  Result Value Ref Range   Sodium 137 135 - 145 mmol/L   Potassium 4.1 3.5 - 5.1 mmol/L   Chloride 104 101 - 111 mmol/L   CO2 23 22 - 32 mmol/L   Glucose, Bld 119 (H) 65 - 99 mg/dL   BUN 9 6 - 20 mg/dL   Creatinine, Ser 1.23 0.61 - 1.24 mg/dL   Calcium 9.2 8.9 - 10.3 mg/dL   Total Protein 6.9 6.5 - 8.1 g/dL   Albumin 4.3 3.5 - 5.0 g/dL   AST 25 15 - 41 U/L   ALT 36 17 - 63 U/L   Alkaline Phosphatase 34 (L) 38 - 126 U/L   Total Bilirubin 1.2 0.3 - 1.2 mg/dL   GFR calc non Af Amer >60 >60 mL/min   GFR calc Af Amer >60 >60 mL/min    Comment: (NOTE) The eGFR has been calculated using the CKD EPI equation. This calculation has not been validated in all clinical situations. eGFR's persistently <60 mL/min signify possible Chronic Kidney Disease.    Anion gap 10 5 - 15  CBC     Status: Abnormal   Collection Time: 05/03/16  5:06 AM  Result Value Ref Range   WBC 9.5 4.0 - 10.5 K/uL   RBC 5.30 4.22 - 5.81 MIL/uL   Hemoglobin 17.5 (H) 13.0 - 17.0 g/dL   HCT 47.6 39.0 - 52.0 %   MCV 89.8 78.0 - 100.0 fL   MCH 33.0 26.0 - 34.0 pg   MCHC 36.8 (H) 30.0 - 36.0 g/dL   RDW 13.0 11.5 - 15.5 %   Platelets 159 150 - 400 K/uL   Urinalysis, Routine w reflex microscopic     Status: Abnormal   Collection Time: 05/03/16  6:26 AM  Result Value Ref Range   Color, Urine STRAW (A) YELLOW   APPearance CLEAR CLEAR   Specific Gravity, Urine 1.019 1.005 - 1.030   pH 7.0 5.0 - 8.0   Glucose, UA NEGATIVE NEGATIVE mg/dL   Hgb urine dipstick NEGATIVE NEGATIVE   Bilirubin Urine NEGATIVE NEGATIVE   Ketones, ur NEGATIVE NEGATIVE mg/dL   Protein, ur NEGATIVE NEGATIVE mg/dL   Nitrite NEGATIVE NEGATIVE   Leukocytes, UA NEGATIVE NEGATIVE   Ct Abdomen Pelvis W Contrast  Result Date: 05/03/2016 CLINICAL DATA:  Lower abdominal pain.  History of diverticulitis. EXAM: CT ABDOMEN AND PELVIS WITH CONTRAST TECHNIQUE: Multidetector CT imaging of the abdomen and pelvis was performed using the standard protocol following bolus administration of intravenous contrast. CONTRAST:  182m ISOVUE-300 IOPAMIDOL (ISOVUE-300) INJECTION 61% COMPARISON:  None. FINDINGS: Lower chest: Mild dependent changes in the lung bases. Hepatobiliary: Diffuse fatty infiltration of the liver. No focal liver lesions. Gallbladder and bile ducts are unremarkable. Pancreas: Unremarkable. No pancreatic ductal dilatation or surrounding inflammatory changes. Spleen: Normal in size without focal abnormality. Adrenals/Urinary Tract: Adrenal glands are unremarkable. Kidneys are normal, without renal calculi, focal lesion, or hydronephrosis. Bladder is unremarkable. Stomach/Bowel: Mildly distended retrocecal appendix with periappendiceal infiltration likely representing early acute appendicitis. No abscess. Multiple right colonic diverticula are present and right-sided diverticulitis with secondary inflammation of the appendix is not entirely excluded. Scattered diverticula throughout the colon. No colonic distention. Stomach and small bowel are decompressed. Vascular/Lymphatic: Aortic atherosclerosis. No enlarged abdominal or pelvic lymph nodes. Reproductive: Prostate is unremarkable.  Other: No abdominal wall hernia or abnormality. No abdominopelvic ascites. Musculoskeletal: No destructive bone lesions. IMPRESSION: Inflammatory changes in the right lower quadrant most likely representing acute appendicitis. However, multiple right colonic diverticula are present and right colonic diverticulitis is not entirely excluded. No abscess. No evidence of bowel obstruction. Diffuse fatty infiltration of the liver. Electronically Signed   By: WLucienne CapersM.D.   On: 05/03/2016 06:36   Assessment/Plan Right lower quadrant pain Differential diagnosis: chronic appendicitis, early acute appendicitis, vs cecal diverticulitis  - patient with a long history of right lower quadrant pain and known diverticula of right colon. This episode of pain is slightly different and more constant compared to the pain he normally experiences with diverticulitis, though the duration of pain being 5 days, along with absences of leukocytosis or fever, is atypical of acute appendicitis. It is possible that previously the patient was experiencing episodes of appendicitis that improved with patients use of oral cipro/flagyl. It is also possible that acute appendicitis started to develop yesterday, given the acute increase in severity of his pain.   I believe patient sxs more consistent with appendicitis. Will admit to [facility] service. NPO. Start IV abx. OR today for laparoscopic appendectomy.  EJill Alexanders [facility] 05/03/2016, 8:03 AM Pager: [phone]: [phone]-Fri 7:00 am-4:30 pm Sat-Sun 7:00 am-11:30 am

## 2016-05-03 NOTE — Transfer of Care (Signed)
Immediate Anesthesia Transfer of Care Note  Patient: John Dunlap  Procedure(s) Performed: Procedure(s): APPENDECTOMY LAPAROSCOPIC (N/A)  Patient Location: PACU  Anesthesia Type:General  Level of Consciousness: awake, alert , oriented and patient cooperative  Airway & Oxygen Therapy: Patient Spontanous Breathing and Patient connected to nasal cannula oxygen  Post-op Assessment: Report given to RN and Post -op Vital signs reviewed and stable  Post vital signs: Reviewed and stable  Last Vitals:  Vitals:   05/03/16 1106 05/03/16 1400  BP: (!) 135/91 121/86  Pulse: 71 68  Resp: 18 18  Temp: 36.9 C 36.8 C    Last Pain:  Vitals:   05/03/16 1400  TempSrc: Oral  PainSc:          Complications: No apparent anesthesia complications

## 2016-05-03 NOTE — ED Notes (Signed)
Surgery at bedside.

## 2016-05-03 NOTE — ED Triage Notes (Signed)
Pt presents to ER with lower abd pain since thur; pt reporting hx of diverticulitis; pt c/o some nausea as well

## 2016-05-03 NOTE — ED Notes (Signed)
Attempted report 

## 2016-05-03 NOTE — Anesthesia Postprocedure Evaluation (Signed)
Anesthesia Post Note  Patient: AMEET SANDY  Procedure(s) Performed: Procedure(s) (LRB): APPENDECTOMY LAPAROSCOPIC (N/A)  Patient location during evaluation: PACU Anesthesia Type: General Level of consciousness: awake and alert Pain management: pain level controlled Vital Signs Assessment: post-procedure vital signs reviewed and stable Respiratory status: spontaneous breathing, nonlabored ventilation, respiratory function stable and patient connected to nasal cannula oxygen Cardiovascular status: blood pressure returned to baseline and stable Postop Assessment: no signs of nausea or vomiting Anesthetic complications: no       Last Vitals:  Vitals:   05/03/16 1954 05/03/16 2002  BP: (!) 143/92 (!) 142/96  Pulse: 98 99  Resp: 14 16  Temp: 36.9 C 36.6 C    Last Pain:  Vitals:   05/03/16 2002  TempSrc: Oral  PainSc:                  Kasi Lasky DAVID

## 2016-05-04 ENCOUNTER — Encounter (HOSPITAL_COMMUNITY): Payer: Self-pay | Admitting: Surgery

## 2016-05-04 LAB — HIV ANTIBODY (ROUTINE TESTING W REFLEX): HIV SCREEN 4TH GENERATION: NONREACTIVE

## 2016-05-04 MED ORDER — OXYCODONE-ACETAMINOPHEN 5-325 MG PO TABS: 1.0000 | ORAL_TABLET | Freq: Four times a day (QID) | ORAL | 0 refills | Status: DC | PRN

## 2016-05-04 MED ORDER — DOCUSATE SODIUM 100 MG PO CAPS: 100.0000 mg | ORAL_CAPSULE | Freq: Two times a day (BID) | ORAL | 0 refills | Status: AC

## 2016-05-04 NOTE — Discharge Instructions (Signed)
[facility] ______[facility] LAPAROSCOPIC SURGERY: POST OP INSTRUCTIONS Always review your discharge instruction sheet given to you by the facility where your surgery was performed. IF YOU HAVE DISABILITY OR FAMILY LEAVE FORMS, YOU MUST BRING THEM TO THE OFFICE FOR PROCESSING.   DO NOT GIVE THEM TO YOUR DOCTOR.  1. A prescription for pain medication may be given to you upon discharge.  Take your pain medication as prescribed, if needed.  If narcotic pain medicine is not needed, then you may take acetaminophen (Tylenol) or ibuprofen (Advil) as needed. 2. Take your usually prescribed medications unless otherwise directed. 3. If you need a refill on your pain medication, please contact your pharmacy.  They will contact our office to request authorization. Prescriptions will not be filled after 5pm or on week-ends. 4. You should follow a light diet the first few days after arrival home, such as soup and crackers, etc.  Be sure to include lots of fluids daily. 5. Most patients will experience some swelling and bruising in the area of the incisions.  Ice packs will help.  Swelling and bruising can take several days to resolve.  6. It is common to experience some constipation if taking pain medication after surgery.  Increasing fluid intake and taking a stool softener (such as Colace) will usually help or prevent this problem from occurring.  A mild laxative (Milk of Magnesia or Miralax) should be taken according to package instructions if there are no bowel movements after 48 hours. 7. Unless discharge instructions indicate otherwise, you may remove your bandages 24-48 hours after surgery, and you may shower at that time.  You may have steri-strips (small skin tapes) in place directly over the incision.  These strips should be left on the skin for 7-10 days.  If your surgeon used skin glue on the incision, you may shower in 24 hours.  The glue will flake off over the next 2-3 weeks.  Any sutures or  staples will be removed at the office during your follow-up visit. 8. ACTIVITIES:  You may resume regular (light) daily activities beginning the next day--such as daily self-care, walking, climbing stairs--gradually increasing activities as tolerated.  You may have sexual intercourse when it is comfortable.  Refrain from any heavy lifting or straining until approved by your doctor. a. You may drive when you are no longer taking prescription pain medication, you can comfortably wear a seatbelt, and you can safely maneuver your car and apply brakes. b. RETURN TO WORK:  4-7 days________________________________________________________ 9. You should see your doctor in the office for a follow-up appointment approximately 2-3 weeks after your surgery.  Make sure that you call for this appointment within a day or two after you arrive home to insure a convenient appointment time. 10. OTHER INSTRUCTIONS: __________________________________________________________________________________________________________________________ __________________________________________________________________________________________________________________________ WHEN TO CALL YOUR DOCTOR: 1. Fever over 101.0 2. Inability to urinate 3. Continued bleeding from incision. 4. Increased pain, redness, or drainage from the incision. 5. Increasing abdominal pain  The clinic staff is available to answer your questions during regular business hours.  Please dont hesitate to call and ask to speak to one of the nurses for clinical concerns.  If you have a medical emergency, go to the nearest emergency room or call 911.  A surgeon from [facility] is always on call at the hospital. [address], [place], [place]  [address] ? [address] [address], [place], [place]   [address] [phone] ? [phone] ? FAX [phone] Web  site: [email or web address]  Laparoscopic Appendectomy, Adult, Care After Refer  to this sheet in the next few weeks. These instructions provide you with information about caring for yourself after your procedure. Your health care provider may also give you more specific instructions. Your treatment has been planned according to current medical practices, but problems sometimes occur. Call your health care provider if you have any problems or questions after your procedure. What can I expect after the procedure? After the procedure, it is common to have:  A decrease in your energy level.  Mild pain in the area where the surgical cuts (incisions) were made.  Constipation. This can be caused by pain medicine and a decrease in your activity. Follow these instructions at home: Medicines   Take over-the-counter and prescription medicines only as told by your health care provider.  Do not drive for 24 hours if you received a sedative.  Do not drive or operate heavy machinery while taking prescription pain medicine.  If you were prescribed an antibiotic medicine, take it as told by your health care provider. Do not stop taking the antibiotic even if you start to feel better. Activity   For 3 weeks or as long as told by your health care provider:  Do not lift anything that is heavier than 10 pounds (4.5 kg).  Do not play contact sports.  Gradually return to your normal activities. Ask your health care provider what activities are safe for you. Bathing   Keep your incisions clean and dry. Clean them as often as told by your health care provider:  Gently wash the incisions with soap and water.  Rinse the incisions with water to remove all soap.  Pat the incisions dry with a clean towel. Do not rub the incisions.  You may take showers after 48 hours.  Do not take baths, swim, or use hot tubs for 2 weeks or as told by your health care provider. Incision care   Follow instructions from your healthcare provider about how to take care of your incisions. Make sure  you:  Wash your hands with soap and water before you change your bandage (dressing). If soap and water are not available, use hand sanitizer.  Change your dressing as told by your health care provider.  Leave stitches (sutures), skin glue, or adhesive strips in place. These skin closures may need to stay in place for 2 weeks or longer. If adhesive strip edges start to loosen and curl up, you may trim the loose edges. Do not remove adhesive strips completely unless your health care provider tells you to do that.  Check your incision areas every day for signs of infection. Check for:  More redness, swelling, or pain.  More fluid or blood.  Warmth.  Pus or a bad smell. Other Instructions   If you were sent home with a drain, follow instructions from your health care provider about how to care for the drain and how to empty it.  Take deep breaths. This helps to prevent your lungs from becoming inflamed.  To relieve and prevent constipation:  Drink plenty of fluids.  Eat plenty of fruits and vegetables.  Keep all follow-up visits as told by your health care provider. This is important. Contact a health care provider if:  You have more redness, swelling, or pain around an incision.  You have more fluid or blood coming from an incision.  Your incision feels warm to the touch.  You have pus or a bad smell coming  from an incision or dressing. °· Your incision edges break open after your sutures have been removed. °· You have increasing pain in your shoulders. °· You feel dizzy or you faint. °· You develop shortness of breath. °· You keep feeling nauseous or vomiting. °· You have diarrhea or you cannot control your bowel functions. °· You lose your appetite. °· You develop swelling or pain in your legs. °Get help right away if: °· You have a fever. °· You develop a rash. °· You have difficulty breathing. °· You have sharp pains in your chest. °This information is not intended to replace  advice given to you by your health care provider. Make sure you discuss any questions you have with your health care provider. °Document Released: 01/03/2005 Document Revised: 06/05/2015 Document Reviewed: 06/23/2014 °Elsevier Interactive Patient Education © 2017 [employer] ° °

## 2016-05-04 NOTE — Progress Notes (Signed)
Pt was ambulating to the hall, tolerating regular diet. Pain is controlled with oral narcotics. Discharge instructions given to pt, verbalized understanding. Discharged to home accompanied by spouse.

## 2016-05-04 NOTE — Discharge Summary (Signed)
Physician Discharge Summary  Patient ID: John Dunlap MRN: [id number] DOB/AGE: [birth year] 46 y.o.  Admit date: 05/03/2016 Discharge date: 05/04/2016  Admission Diagnoses:  Discharge Diagnoses:  Active Problems:   Acute appendicitis   Discharged Condition: good  Hospital Course: He was admitted with acute appendicitis and underwent uneventful appendectomy. On the day of discharge he was able to tolerate a diet, his right sided abdominal pain was gone and his incisional pain was well controlled.   Consults: None  Significant Diagnostic Studies: labs and CT, see EPIC chart  Treatments: surgery: laparoscopic appendectomy, IV antibiotics  Discharge Exam: Blood pressure (!) 166/76, pulse 71, temperature 97.9 F (36.6 C), temperature source Oral, resp. rate 17, height  (1.778 m), weight 85.5 kg (188 lb 9.6 oz), SpO2 97 %. General appearance: alert and cooperative GI: soft, appropriately tender around incisions.  Skin: Skin color, texture, turgor normal. No rashes or lesions Incision/Wound: c/d/I with dermabond  Disposition: He will be going home with his family.   Discharge instructions- see AVS  Discharge Instructions    Increase activity slowly    Complete by:  As directed      Allergies as of 05/04/2016   No Known Allergies     Medication List    TAKE these medications   cholecalciferol 1000 units tablet Commonly known as:  VITAMIN D Take 1,000 Units by mouth 3 (three) times a week.   clonazePAM 0.5 MG tablet Commonly known as:  KLONOPIN Take 0.5 mg by mouth 2 (two) times daily as needed for anxiety.   docusate sodium 100 MG capsule Commonly known as:  COLACE Take 1 capsule (100 mg total) by mouth 2 (two) times daily.   fluticasone 50 MCG/ACT nasal spray Commonly known as:  FLONASE Place 1-2 sprays into both nostrils daily as needed for allergies or rhinitis.   hydroxypropyl methylcellulose / hypromellose 2.5 % ophthalmic solution Commonly known  as:  ISOPTO TEARS / GONIOVISC Place 1 drop into both eyes 3 (three) times daily as needed for dry eyes.   meloxicam 15 MG tablet Commonly known as:  MOBIC Take 15 mg by mouth daily.   oxyCODONE-acetaminophen 5-325 MG tablet Commonly known as:  PERCOCET/ROXICET Take 1 tablet by mouth every 6 (six) hours as needed for severe pain.   testosterone cypionate 200 MG/ML injection Commonly known as:  DEPOTESTOSTERONE CYPIONATE Inject 200 mg into the muscle See admin instructions. Every 10 days      Follow-up Information    [facility] Follow up in 3 week(s).   Specialty:  General Surgery Contact information: [address] [place] [address] [phone]          Signed: Berna Bue 05/04/2016, 6:23 AM

## 2016-05-08 ENCOUNTER — Encounter (HOSPITAL_COMMUNITY): Payer: Self-pay | Admitting: *Deleted

## 2016-05-08 ENCOUNTER — Emergency Department (HOSPITAL_COMMUNITY): Payer: BLUE CROSS/BLUE SHIELD

## 2016-05-08 ENCOUNTER — Emergency Department (HOSPITAL_COMMUNITY)
Admission: EM | Admit: 2016-05-08 | Discharge: 2016-05-08 | Disposition: A | Discharge status: Home / Self Care | Payer: BLUE CROSS/BLUE SHIELD | Attending: Emergency Medicine | Admitting: Emergency Medicine

## 2016-05-08 DIAGNOSIS — K5792 Diverticulitis of intestine, part unspecified, without perforation or abscess without bleeding: Secondary | ICD-10-CM

## 2016-05-08 DIAGNOSIS — Z87891 Personal history of nicotine dependence: Secondary | ICD-10-CM | POA: Insufficient documentation

## 2016-05-08 DIAGNOSIS — K529 Noninfective gastroenteritis and colitis, unspecified: Secondary | ICD-10-CM | POA: Diagnosis not present

## 2016-05-08 DIAGNOSIS — R1032 Left lower quadrant pain: Secondary | ICD-10-CM | POA: Diagnosis present

## 2016-05-08 LAB — COMPREHENSIVE METABOLIC PANEL
ALBUMIN: 4.4 g/dL (ref 3.5–5.0)
ALK PHOS: 36 U/L — AB (ref 38–126)
ALT: 57 U/L (ref 17–63)
AST: 36 U/L (ref 15–41)
Anion gap: 10 (ref 5–15)
BUN: 11 mg/dL (ref 6–20)
CHLORIDE: 101 mmol/L (ref 101–111)
CO2: 24 mmol/L (ref 22–32)
CREATININE: 1.35 mg/dL — AB (ref 0.61–1.24)
Calcium: 9.3 mg/dL (ref 8.9–10.3)
GFR calc non Af Amer: >60 mL/min (ref >60)
GLUCOSE: 105 mg/dL — AB (ref 65–99)
Potassium: 4.1 mmol/L (ref 3.5–5.1)
SODIUM: 135 mmol/L (ref 135–145)
Total Bilirubin: 1.3 mg/dL — ABNORMAL HIGH (ref 0.3–1.2)
Total Protein: 7.4 g/dL (ref 6.5–8.1)

## 2016-05-08 LAB — CBC
HCT: 48.9 % (ref 39.0–52.0)
Hemoglobin: 18.1 g/dL — ABNORMAL HIGH (ref 13.0–17.0)
MCH: 33.2 pg (ref 26.0–34.0)
MCHC: 37 g/dL — AB (ref 30.0–36.0)
MCV: 89.6 fL (ref 78.0–100.0)
PLATELETS: 231 10*3/uL (ref 150–400)
RBC: 5.46 MIL/uL (ref 4.22–5.81)
RDW: 12.8 % (ref 11.5–15.5)
WBC: 12.2 10*3/uL — AB (ref 4.0–10.5)

## 2016-05-08 LAB — URINALYSIS, ROUTINE W REFLEX MICROSCOPIC
BILIRUBIN URINE: NEGATIVE
GLUCOSE, UA: NEGATIVE mg/dL
HGB URINE DIPSTICK: NEGATIVE
KETONES UR: NEGATIVE mg/dL
LEUKOCYTES UA: NEGATIVE
Nitrite: NEGATIVE
PROTEIN: NEGATIVE mg/dL
Specific Gravity, Urine: 1.013 (ref 1.005–1.030)
pH: 6 (ref 5.0–8.0)

## 2016-05-08 LAB — LIPASE, BLOOD: Lipase: 23 U/L (ref 11–51)

## 2016-05-08 LAB — I-STAT CG4 LACTIC ACID, ED: Lactic Acid, Venous: 1.31 mmol/L (ref 0.5–1.9)

## 2016-05-08 MED ORDER — ONDANSETRON HCL 4 MG/2ML IJ SOLN
4.0000 mg | Freq: Once | INTRAMUSCULAR | Status: AC
  Administered 2016-05-08: 4 mg via INTRAVENOUS
  Filled 2016-05-08: qty 2

## 2016-05-08 MED ORDER — MORPHINE SULFATE (PF) 4 MG/ML IV SOLN
4.0000 mg | Freq: Once | INTRAVENOUS | Status: AC
  Administered 2016-05-08: 4 mg via INTRAVENOUS
  Filled 2016-05-08: qty 1

## 2016-05-08 MED ORDER — CIPROFLOXACIN IN D5W 400 MG/200ML IV SOLN
400.0000 mg | Freq: Once | INTRAVENOUS | Status: AC
  Administered 2016-05-08: 400 mg via INTRAVENOUS
  Filled 2016-05-08: qty 200

## 2016-05-08 MED ORDER — CIPROFLOXACIN HCL 500 MG PO TABS: 500.0000 mg | ORAL_TABLET | Freq: Two times a day (BID) | ORAL | 0 refills | Status: DC

## 2016-05-08 MED ORDER — METRONIDAZOLE 500 MG PO TABS: 500.0000 mg | ORAL_TABLET | Freq: Two times a day (BID) | ORAL | 0 refills | Status: DC

## 2016-05-08 MED ORDER — IOPAMIDOL (ISOVUE-300) INJECTION 61%
INTRAVENOUS | Status: AC
  Administered 2016-05-08: 100 mL
  Filled 2016-05-08: qty 100

## 2016-05-08 MED ORDER — SODIUM CHLORIDE 0.9 % IV BOLUS (SEPSIS)
1000.0000 mL | Freq: Once | INTRAVENOUS | Status: AC
  Administered 2016-05-08: 1000 mL via INTRAVENOUS

## 2016-05-08 MED ORDER — HYDROCODONE-ACETAMINOPHEN 5-325 MG PO TABS: 2.0000 | ORAL_TABLET | ORAL | 0 refills | Status: DC | PRN

## 2016-05-08 MED ORDER — IOPAMIDOL (ISOVUE-300) INJECTION 61%
INTRAVENOUS | Status: AC
  Filled 2016-05-08: qty 30

## 2016-05-08 MED ORDER — METRONIDAZOLE IN NACL 5-0.79 MG/ML-% IV SOLN
500.0000 mg | Freq: Once | INTRAVENOUS | Status: AC
  Administered 2016-05-08: 500 mg via INTRAVENOUS
  Filled 2016-05-08: qty 100

## 2016-05-08 NOTE — ED Notes (Signed)
Patient transported to CT 

## 2016-05-08 NOTE — ED Provider Notes (Signed)
The patient was receiving care hand off by PA Nadeau pending CT scan results. Please see her previous note for complete history and physical. To summarize patient is here with left lower quadrant abdominal pain that started just today. Associated with nausea and diarrhea. Patient recently had appendectomy performed 05/03/16 for acute appendicitis. Denies any fever, chills, blood in stool, emesis, pain, redness, drainage with the incision site. Does have history of diverticulosis without any history of diverticulitis. Patient did have focal left lower quadrant abdominal pain on exam.   CT does show uncomplicated diverticulitis without any abscess or perforation.Mild  Leukocytosis of [address] is noted. Patient is afebrile. No tachycardia. Does not meet SIRS or sepsis criteria. Repeat abdominal exam is benign. No signs of peritonitis. Urine without signs of infection. Patient is able tolerate by mouth fluids without any difficulties. We'll give dose of IV antibiotics in the ED and will be sent home on Flagyl and Cipro for 7 days. Patient was given a short course of pain medicine. Has nausea and pain medicine at home. Patient feels comfortable with discharge home. Do not feel the patient meets inpatient criteria for diverticulitis. We will try withhold antibiotics. Patient agreeable to above plan. Pt is hemodynamically stable, in NAD, & able to ambulate in the ED. Pain has been managed & has no complaints prior to dc. Pt is comfortable with above plan and is stable for discharge at this time. All questions were answered prior to disposition. Strict return precautions for f/u to the ED were discussed. Have encouraged follow-up with PCP.  Vitals:   05/08/16 1806 05/08/16 1830  BP: 131/88 (!) 144/90  Pulse: 81 75  Resp:  16  Temp:      Results for orders placed or performed during the hospital encounter of 05/08/16  Lipase, blood  Result Value Ref Range   Lipase 23 11 - 51 U/L  Comprehensive metabolic panel   Result Value Ref Range   Sodium 135 135 - 145 mmol/L   Potassium 4.1 3.5 - 5.1 mmol/L   Chloride 101 101 - 111 mmol/L   CO2 24 22 - 32 mmol/L   Glucose, Bld 105 (H) 65 - 99 mg/dL   BUN 11 6 - 20 mg/dL   Creatinine, Ser 6.04 (H) 0.61 - 1.24 mg/dL   Calcium 9.3 8.9 - 54.0 mg/dL   Total Protein 7.4 6.5 - 8.1 g/dL   Albumin 4.4 3.5 - 5.0 g/dL   AST 36 15 - 41 U/L   ALT 57 17 - 63 U/L   Alkaline Phosphatase 36 (L) 38 - 126 U/L   Total Bilirubin 1.3 (H) 0.3 - 1.2 mg/dL   GFR calc non Af Amer >60 >60 mL/min   GFR calc Af Amer >60 >60 mL/min   Anion gap 10 5 - 15  CBC  Result Value Ref Range   WBC 12.2 (H) 4.0 - 10.5 K/uL   RBC 5.46 4.22 - 5.81 MIL/uL   Hemoglobin 18.1 (H) 13.0 - 17.0 g/dL   HCT 98.1 19.1 - 47.8 %   MCV 89.6 78.0 - 100.0 fL   MCH 33.2 26.0 - 34.0 pg   MCHC 37.0 (H) 30.0 - 36.0 g/dL   RDW 29.5 62.1 - 30.8 %   Platelets 231 150 - 400 K/uL  Urinalysis, Routine w reflex microscopic  Result Value Ref Range   Color, Urine YELLOW YELLOW   APPearance CLEAR CLEAR   Specific Gravity, Urine 1.013 1.005 - 1.030   pH 6.0 5.0 - 8.0  Glucose, UA NEGATIVE NEGATIVE mg/dL   Hgb urine dipstick NEGATIVE NEGATIVE   Bilirubin Urine NEGATIVE NEGATIVE   Ketones, ur NEGATIVE NEGATIVE mg/dL   Protein, ur NEGATIVE NEGATIVE mg/dL   Nitrite NEGATIVE NEGATIVE   Leukocytes, UA NEGATIVE NEGATIVE  I-Stat CG4 Lactic Acid, ED  Result Value Ref Range   Lactic Acid, Venous 1.31 0.5 - 1.9 mmol/L   Ct Abdomen Pelvis W Contrast  Addendum Date: 05/08/2016   ADDENDUM REPORT: 05/08/2016 17:30 ADDENDUM: In the original dictation, the presence of a normal appendix was noted. However, after discussion with the referring clinician, the patient has recently undergone appendectomy. What was perceived to be a normal appendix is in fact the suture line adjacent to the cecum. The patient is status post appendectomy. Electronically Signed   By: Trudie Reed M.D.   On: 05/08/2016 17:30   Result Date:  05/08/2016 CLINICAL DATA:  46 year old male presenting with lower abdominal pain and diarrhea. EXAM: CT ABDOMEN AND PELVIS WITH CONTRAST TECHNIQUE: Multidetector CT imaging of the abdomen and pelvis was performed using the standard protocol following bolus administration of intravenous contrast. CONTRAST:  ISOVUE-300 IOPAMIDOL (ISOVUE-300) INJECTION 61% COMPARISON:  CT the abdomen and pelvis 05/03/2016. FINDINGS: Lower chest: Mild scarring in the lung bases bilaterally. Hepatobiliary: Diffuse low attenuation throughout the hepatic parenchyma, compatible with hepatic steatosis. No suspicious cystic or solid hepatic lesions. No intra or extrahepatic biliary ductal dilatation. Gallbladder is normal in appearance. Pancreas: No pancreatic mass. No pancreatic ductal dilatation. No pancreatic or peripancreatic fluid or inflammatory changes. Spleen: Unremarkable. Adrenals/Urinary Tract: Bilateral kidneys and bilateral adrenal glands are normal in appearance. There is no hydroureteronephrosis. Urinary bladder is normal in appearance. Stomach/Bowel: Normal appearance of the stomach. No pathologic dilatation of small bowel or colon. Numerous colonic diverticulae are noted, and in the region of the distal descending colon there is extensive surrounding inflammatory changes, compatible with an acute diverticulitis. No discrete diverticular abscess is noted at this time. Normal appendix. Vascular/Lymphatic: Aortic atherosclerosis, without evidence of aneurysm or dissection in the abdominal or pelvic vasculature. No lymphadenopathy noted in the abdomen. Reproductive: Prostate gland and seminal vesicles are unremarkable in appearance. Other: No significant volume of ascites.  No pneumoperitoneum. Musculoskeletal: There are no aggressive appearing lytic or blastic lesions noted in the visualized portions of the skeleton. IMPRESSION: 1. Acute diverticulitis of the distal descending colon. No diverticular abscess or signs of  frank perforation are noted at this time. 2. Hepatic steatosis. 3. Aortic atherosclerosis. 4. Normal appendix. Electronically Signed: By: Trudie Reed M.D. On: 05/08/2016 17:10   Ct Abdomen Pelvis W Contrast  Result Date: 05/03/2016 CLINICAL DATA:  Lower abdominal pain.  History of diverticulitis. EXAM: CT ABDOMEN AND PELVIS WITH CONTRAST TECHNIQUE: Multidetector CT imaging of the abdomen and pelvis was performed using the standard protocol following bolus administration of intravenous contrast. CONTRAST:  ISOVUE-300 IOPAMIDOL (ISOVUE-300) INJECTION 61% COMPARISON:  None. FINDINGS: Lower chest: Mild dependent changes in the lung bases. Hepatobiliary: Diffuse fatty infiltration of the liver. No focal liver lesions. Gallbladder and bile ducts are unremarkable. Pancreas: Unremarkable. No pancreatic ductal dilatation or surrounding inflammatory changes. Spleen: Normal in size without focal abnormality. Adrenals/Urinary Tract: Adrenal glands are unremarkable. Kidneys are normal, without renal calculi, focal lesion, or hydronephrosis. Bladder is unremarkable. Stomach/Bowel: Mildly distended retrocecal appendix with periappendiceal infiltration likely representing early acute appendicitis. No abscess. Multiple right colonic diverticula are present and right-sided diverticulitis with secondary inflammation of the appendix is not entirely excluded. Scattered diverticula throughout the colon.  No colonic distention. Stomach and small bowel are decompressed. Vascular/Lymphatic: Aortic atherosclerosis. No enlarged abdominal or pelvic lymph nodes. Reproductive: Prostate is unremarkable. Other: No abdominal wall hernia or abnormality. No abdominopelvic ascites. Musculoskeletal: No destructive bone lesions. IMPRESSION: Inflammatory changes in the right lower quadrant most likely representing acute appendicitis. However, multiple right colonic diverticula are present and right colonic diverticulitis is not entirely  excluded. No abscess. No evidence of bowel obstruction. Diffuse fatty infiltration of the liver. Electronically Signed   By: Burman Nieves M.D.   On: 05/03/2016 06:36        Rise Mu, PA-C 05/08/16 1859    Vanetta Mulders, MD 05/11/16 [phone]

## 2016-05-08 NOTE — Discharge Instructions (Signed)
Your CT scan does show diverticulitis. Had been given antibiotics in the ED. Please take the 2 antibiotics at home for the next 7 days. Use the pain medicine as needed. Motrin as needed. Follow-up with your primary care doctor if symptoms do not resolve. Return to ED if her symptoms worsen or if he develop any fevers, unable to control her vomiting, worsening pain or for any reason.

## 2016-05-08 NOTE — ED Triage Notes (Signed)
Pt reports having appendectomy done on Tuesday, was feeling good until last night. Onset of lower to left side abd pain with diarrhea. Denies n/v or fever.

## 2016-05-08 NOTE — ED Provider Notes (Signed)
[facility]-EMERGENCY DEPT Provider Note   CSN: [id number] Arrival date & time: 05/08/16  1145     History   Chief Complaint Chief Complaint  Patient presents with  . Abdominal Pain  . Post-op Problem    HPI John Dunlap is a 46 y.o. male.  HPI   Patient is a 46 year old male with history of diverticulosis and recent appendectomy on 05/03/16 who presents the ED with complaint of abdominal pain, onset last night. Patient reports after eating dinner last night he began having constant sharp pain to his lower abdomen, worse over the left lower quadrant. Endorses associated nausea and 4-5 episodes of nonbloody diarrhea. Reports taking one dose of his home pain meds early this morning without relief. Denies fever, chills, chest pain, cough, shortness of breath, vomiting, urinary symptoms, blood in urine or stool, flank pain. Patient denies any swelling, redness or drainage from surgical incision sites. Patient denies any other history of abdominal surgeries.  Past Medical History:  Diagnosis Date  . Diverticulosis   . H/O seasonal allergies   . Plantar fasciitis     Patient Active Problem List   Diagnosis Date Noted  . Acute appendicitis 05/03/2016    Past Surgical History:  Procedure Laterality Date  . LAPAROSCOPIC APPENDECTOMY N/A 05/03/2016   Procedure: APPENDECTOMY LAPAROSCOPIC;  Surgeon: Berna Bue, MD;  Location: [facility] OR;  Service: General;  Laterality: N/A;       Home Medications    Prior to Admission medications   Medication Sig Start Date End Date Taking? Authorizing Provider  cholecalciferol (VITAMIN D) 1000 units tablet Take 1,000 Units by mouth 3 (three) times a week.    Historical Provider, MD  clonazePAM (KLONOPIN) 0.5 MG tablet Take 0.5 mg by mouth 2 (two) times daily as needed for anxiety.    Historical Provider, MD  docusate sodium (COLACE) 100 MG capsule Take 1 capsule (100 mg total) by mouth 2 (two) times daily. 05/04/16   Berna Bue, MD    fluticasone (FLONASE) 50 MCG/ACT nasal spray Place 1-2 sprays into both nostrils daily as needed for allergies or rhinitis.    Historical Provider, MD  hydroxypropyl methylcellulose / hypromellose (ISOPTO TEARS / GONIOVISC) 2.5 % ophthalmic solution Place 1 drop into both eyes 3 (three) times daily as needed for dry eyes.    Historical Provider, MD  meloxicam (MOBIC) 15 MG tablet Take 15 mg by mouth daily.    Historical Provider, MD  oxyCODONE-acetaminophen (PERCOCET/ROXICET) 5-325 MG tablet Take 1 tablet by mouth every 6 (six) hours as needed for severe pain. 05/04/16   Berna Bue, MD  testosterone cypionate (DEPOTESTOSTERONE CYPIONATE) 200 MG/ML injection Inject 200 mg into the muscle See admin instructions. Every 10 days    Historical Provider, MD    Family History History reviewed. No pertinent family history.  Social History Social History  Substance Use Topics  . Smoking status: Former Games developer  . Smokeless tobacco: Never Used     Comment: QUIT IN 2002  . Alcohol use Yes     Comment: 3-4x week     Allergies   Patient has no known allergies.   Review of Systems Review of Systems  Gastrointestinal: Positive for abdominal pain, diarrhea and nausea.  All other systems reviewed and are negative.    Physical Exam Updated Vital Signs BP (!) 133/92 (BP Location: Right Arm)   Pulse 95   Temp 99 F (37.2 C) (Oral)   Resp 18   SpO2 97%   Physical  Exam  Constitutional: He is oriented to person, place, and time. He appears well-developed and well-nourished. No distress.  HENT:  Head: Normocephalic and atraumatic.  Mouth/Throat: Uvula is midline, oropharynx is clear and moist and mucous membranes are normal. No oropharyngeal exudate, posterior oropharyngeal edema, posterior oropharyngeal erythema or tonsillar abscesses. No tonsillar exudate.  Eyes: Conjunctivae and EOM are normal. Right eye exhibits no discharge. Left eye exhibits no discharge. No scleral icterus.  Neck:  Normal range of motion. Neck supple.  Cardiovascular: Normal rate, regular rhythm, normal heart sounds and intact distal pulses.   Pulmonary/Chest: Effort normal and breath sounds normal. No respiratory distress. He has no wheezes. He has no rales. He exhibits no tenderness.  Abdominal: Soft. Bowel sounds are normal. He exhibits no distension and no mass. There is tenderness in the left lower quadrant. There is no rigidity, no rebound, no guarding and no CVA tenderness. No hernia.    3 well-healing surgical laparoscopy incisions present, no surrounding swelling, erythema, warmth or drainage.  Musculoskeletal: He exhibits no edema.  Neurological: He is alert and oriented to person, place, and time.  Skin: Skin is warm and dry. He is not diaphoretic.  Nursing note and vitals reviewed.    ED Treatments / Results  Labs (all labs ordered are listed, but only abnormal results are displayed) Labs Reviewed  COMPREHENSIVE METABOLIC PANEL - Abnormal; Notable for the following:       Result Value   Glucose, Bld 105 (*)    Creatinine, Ser 1.35 (*)    Alkaline Phosphatase 36 (*)    Total Bilirubin 1.3 (*)    All other components within normal limits  CBC - Abnormal; Notable for the following:    WBC 12.2 (*)    Hemoglobin 18.1 (*)    MCHC 37.0 (*)    All other components within normal limits  LIPASE, BLOOD  URINALYSIS, ROUTINE W REFLEX MICROSCOPIC  I-STAT CG4 LACTIC ACID, ED    EKG  EKG Interpretation None       Radiology No results found.  Procedures Procedures (including critical care time)  Medications Ordered in ED Medications  sodium chloride 0.9 % bolus 1,000 mL (not administered)  morphine 4 MG/ML injection 4 mg (not administered)  ondansetron (ZOFRAN) injection 4 mg (not administered)     Initial Impression / Assessment and Plan / ED Course  I have reviewed the triage vital signs and the nursing notes.  Pertinent labs & imaging results that were available during  my care of the patient were reviewed by me and considered in my medical decision making (see chart for details).     Patient presented with new onset left lower abdominal pain with associated nausea and diarrhea that started last night. Reports having recent appendectomy performed on Tuesday without any consultations. Denies currently being on any antibiotics. VSS. Exam revealed tenderness over left lower quadrant, no peritoneal signs. Multiple well-healing upper scopic surgeons present without signs of infection. Remaining exam unremarkable. Patient given IV fluids, pain meds and antiemetics. Labs reveal WBC 12.2, creatinine 1.35, otherwise unremarkable. UA negative. Plan to order CT abdomen for further evaluation.   Hand-off to [employer], PA-C. Pending CT abdomen.  Final Clinical Impressions(s) / ED Diagnoses   Final diagnoses:  None    New Prescriptions New Prescriptions   No medications on file     Barrett Henle, PA-C 05/08/16 1636    Gerhard Munch, MD 05/09/16 [phone]

## 2019-04-01 IMAGING — CT CT ABD-PELV W/ CM
2 of 5 series · 9 of 46 positions shown, 10 images · IV contrast (Iodine)
Comparison: None.

CLINICAL DATA: Lower abdominal pain.  History of diverticulitis.

EXAM:
CT ABDOMEN AND PELVIS WITH CONTRAST
TECHNIQUE: Multidetector CT imaging of the abdomen and pelvis was performed
using the standard protocol following bolus administration of
intravenous contrast.
CONTRAST:  100mL 51BENG-9NN IOPAMIDOL (51BENG-9NN) INJECTION 61%

[Series 201: routine, idose (2) · axial · 0.90mm/px · z∈[+43,+433]mm · 6 of 100 slices shown, 7 images]
[im 11/100  soft-tissue]
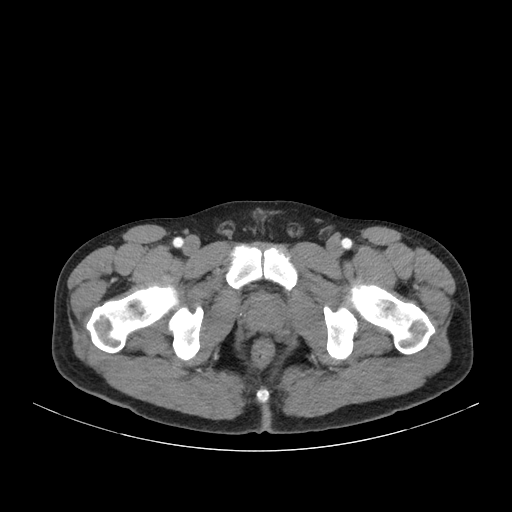
[im 11/100  bone]
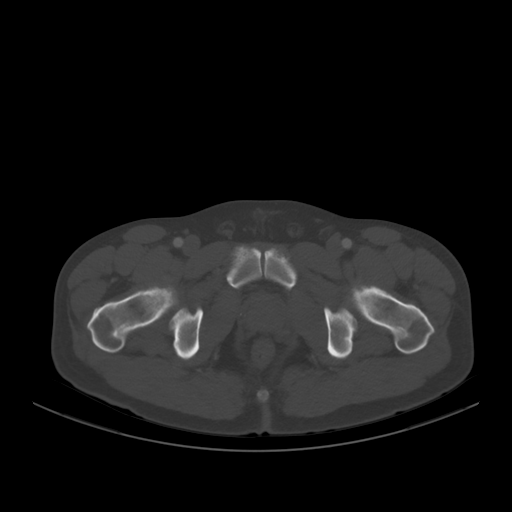
[im 27/100  soft-tissue]
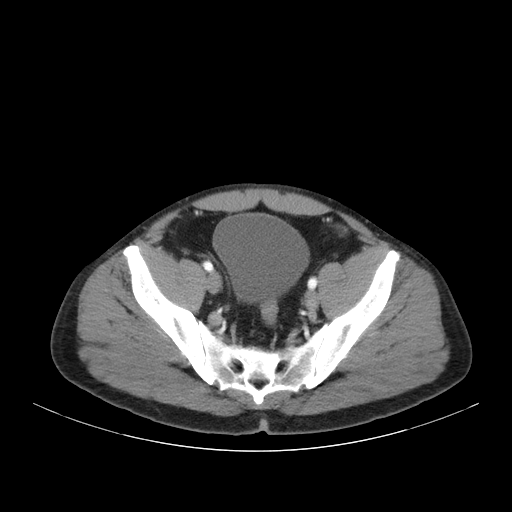
[im 42/100  soft-tissue]
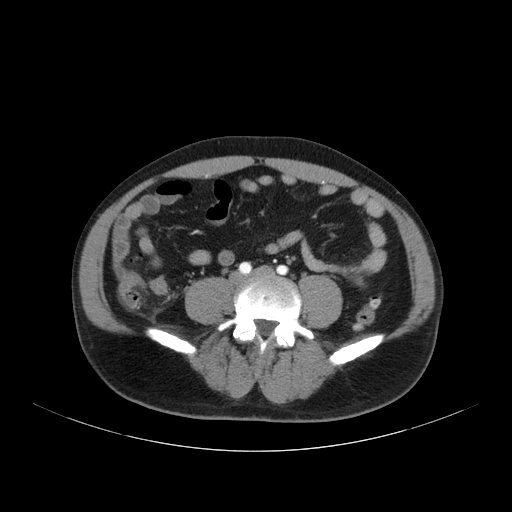
[im 58/100  soft-tissue]
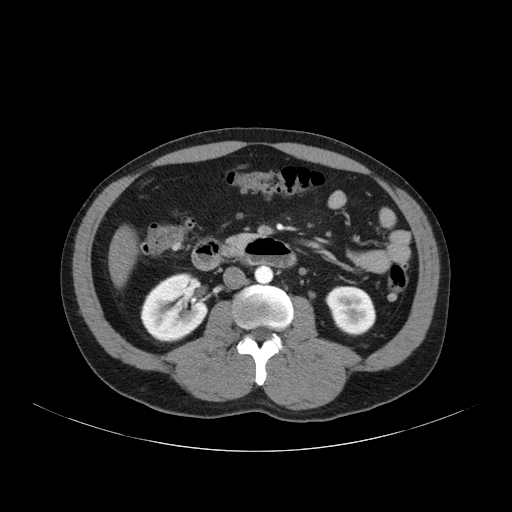
[im 73/100  soft-tissue]
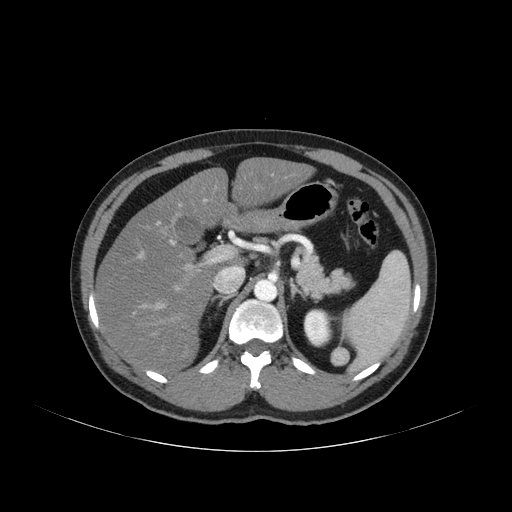
[im 89/100  soft-tissue]
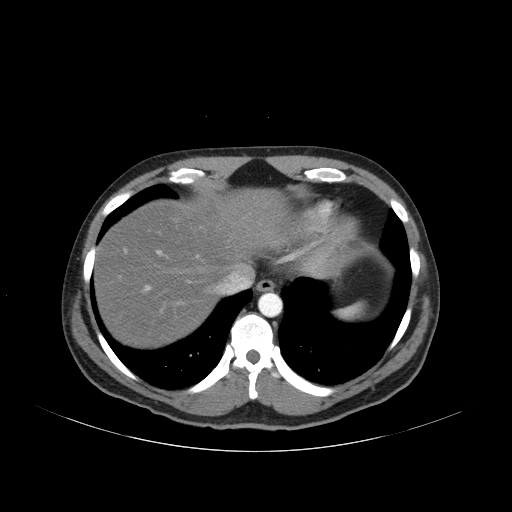

[Series 203: coronals, idose (2) · coronal · 0.45mm/px · 3 of 127 slices shown]
[im 43/127  soft-tissue]
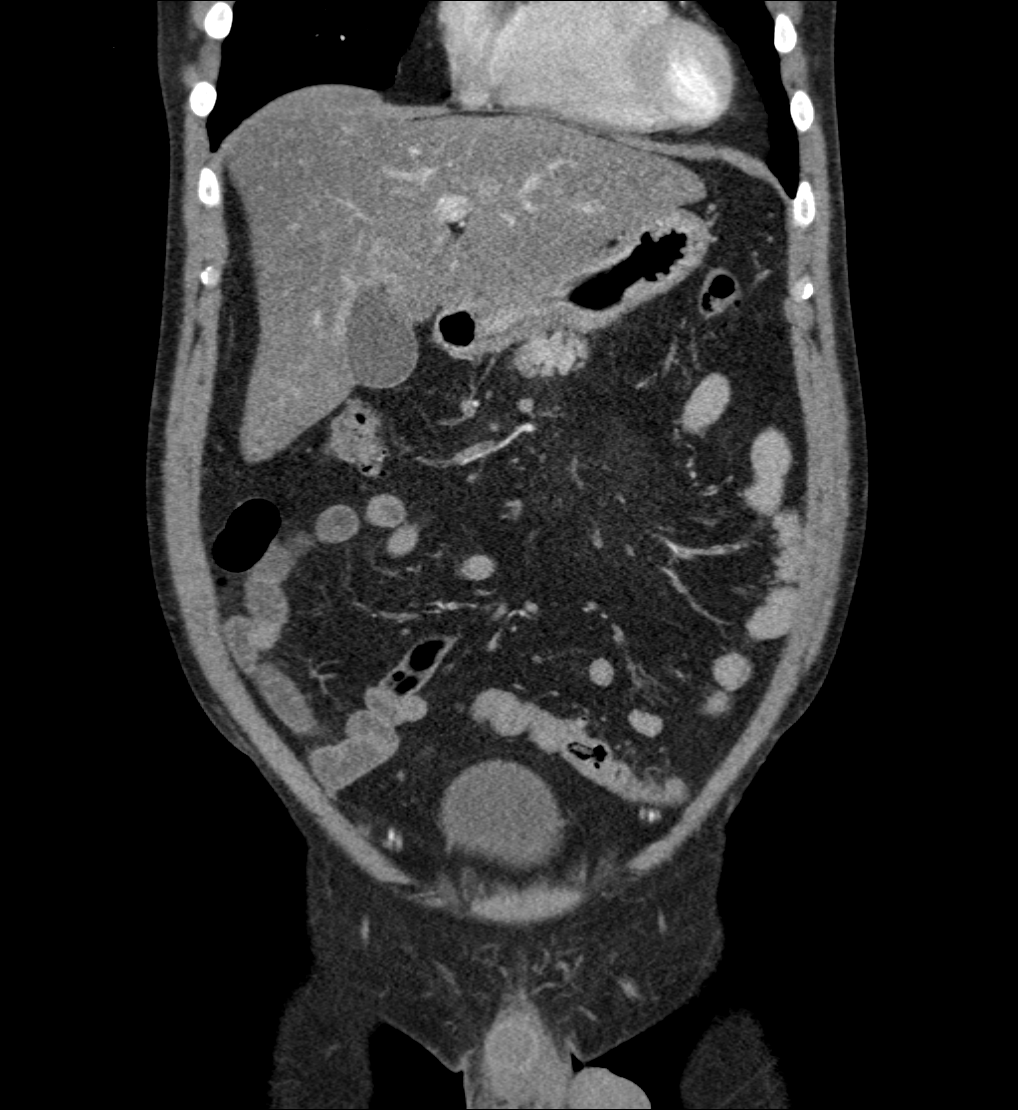
[im 57/127  soft-tissue]
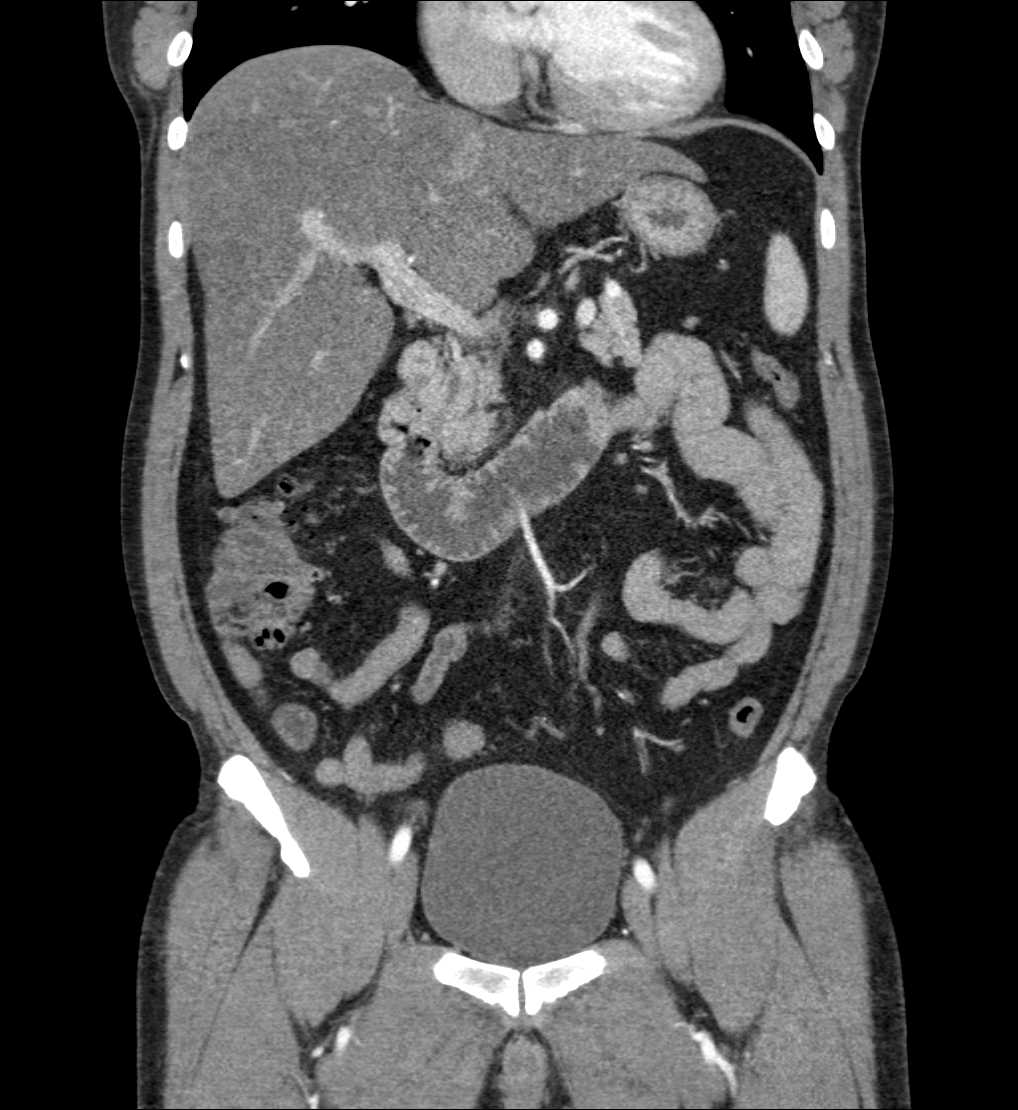
[im 71/127  soft-tissue]
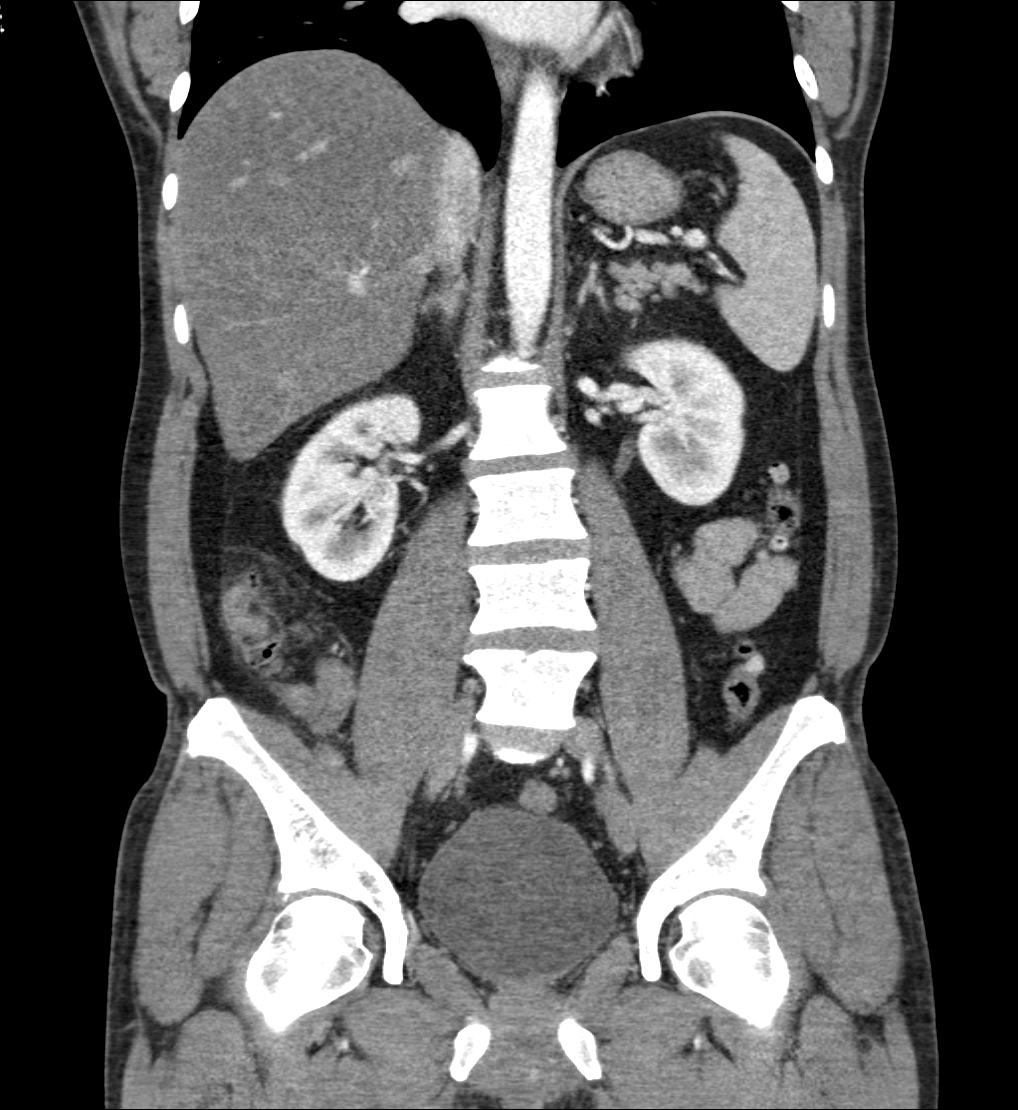

[9 of 46 positions shown; findings below may reference images not displayed]

FINDINGS: Lower chest: Mild dependent changes in the lung bases.

Hepatobiliary: Diffuse fatty infiltration of the liver. No focal
liver lesions. Gallbladder and bile ducts are unremarkable.

Pancreas: Unremarkable. No pancreatic ductal dilatation or
surrounding inflammatory changes.

Spleen: Normal in size without focal abnormality.

Adrenals/Urinary Tract: Adrenal glands are unremarkable. Kidneys are
normal, without renal calculi, focal lesion, or hydronephrosis.
Bladder is unremarkable.

Stomach/Bowel: Mildly distended retrocecal appendix with
periappendiceal infiltration likely representing early acute
appendicitis. No abscess. Multiple right colonic diverticula are
present and right-sided diverticulitis with secondary inflammation
of the appendix is not entirely excluded. Scattered diverticula
throughout the colon. No colonic distention. Stomach and small bowel
are decompressed.

Vascular/Lymphatic: Aortic atherosclerosis. No enlarged abdominal or
pelvic lymph nodes.

Reproductive: Prostate is unremarkable.

Other: No abdominal wall hernia or abnormality. No abdominopelvic
ascites.

Musculoskeletal: No destructive bone lesions.
IMPRESSION: Inflammatory changes in the right lower quadrant most likely
representing acute appendicitis. However, multiple right colonic
diverticula are present and right colonic diverticulitis is not
entirely excluded. No abscess. No evidence of bowel obstruction.
Diffuse fatty infiltration of the liver.

## 2021-11-03 ENCOUNTER — Emergency Department (HOSPITAL_BASED_OUTPATIENT_CLINIC_OR_DEPARTMENT_OTHER): Payer: BC Managed Care – PPO

## 2021-11-03 ENCOUNTER — Other Ambulatory Visit: Payer: Self-pay

## 2021-11-03 ENCOUNTER — Emergency Department (HOSPITAL_BASED_OUTPATIENT_CLINIC_OR_DEPARTMENT_OTHER)
Admission: EM | Admit: 2021-11-03 | Discharge: 2021-11-03 | Disposition: A | Discharge status: Home / Self Care | Payer: BC Managed Care – PPO | Attending: Emergency Medicine | Admitting: Emergency Medicine

## 2021-11-03 ENCOUNTER — Encounter (HOSPITAL_BASED_OUTPATIENT_CLINIC_OR_DEPARTMENT_OTHER): Payer: Self-pay | Admitting: Emergency Medicine

## 2021-11-03 DIAGNOSIS — K5790 Diverticulosis of intestine, part unspecified, without perforation or abscess without bleeding: Secondary | ICD-10-CM | POA: Diagnosis not present

## 2021-11-03 DIAGNOSIS — K5792 Diverticulitis of intestine, part unspecified, without perforation or abscess without bleeding: Secondary | ICD-10-CM

## 2021-11-03 DIAGNOSIS — R1031 Right lower quadrant pain: Secondary | ICD-10-CM | POA: Diagnosis present

## 2021-11-03 LAB — COMPREHENSIVE METABOLIC PANEL
ALT: 35 U/L (ref 0–44)
AST: 29 U/L (ref 15–41)
Albumin: 4.9 g/dL (ref 3.5–5.0)
Alkaline Phosphatase: 36 U/L — ABNORMAL LOW (ref 38–126)
Anion gap: 8 (ref 5–15)
BUN: 13 mg/dL (ref 6–20)
CO2: 29 mmol/L (ref 22–32)
Calcium: 9.4 mg/dL (ref 8.9–10.3)
Chloride: 99 mmol/L (ref 98–111)
Creatinine, Ser: 1.17 mg/dL (ref 0.61–1.24)
GFR, Estimated: >60 mL/min (ref >60)
Glucose, Bld: 83 mg/dL (ref 70–99)
Potassium: 4.1 mmol/L (ref 3.5–5.1)
Sodium: 136 mmol/L (ref 135–145)
Total Bilirubin: 2.9 mg/dL — ABNORMAL HIGH (ref 0.3–1.2)
Total Protein: 7.9 g/dL (ref 6.5–8.1)

## 2021-11-03 LAB — CBC WITH DIFFERENTIAL/PLATELET
Abs Immature Granulocytes: 0.06 10*3/uL (ref 0.00–0.07)
Basophils Absolute: 0 10*3/uL (ref 0.0–0.1)
Basophils Relative: 0 %
Eosinophils Absolute: 0.1 10*3/uL (ref 0.0–0.5)
Eosinophils Relative: 1 %
HCT: 46 % (ref 39.0–52.0)
Hemoglobin: 16.2 g/dL (ref 13.0–17.0)
Immature Granulocytes: 1 %
Lymphocytes Relative: 11 %
Lymphs Abs: 1.4 10*3/uL (ref 0.7–4.0)
MCH: 32.4 pg (ref 26.0–34.0)
MCHC: 35.2 g/dL (ref 30.0–36.0)
MCV: 92 fL (ref 80.0–100.0)
Monocytes Absolute: 1.3 10*3/uL — ABNORMAL HIGH (ref 0.1–1.0)
Monocytes Relative: 10 %
Neutro Abs: 9.6 10*3/uL — ABNORMAL HIGH (ref 1.7–7.7)
Neutrophils Relative %: 77 %
Platelets: 162 10*3/uL (ref 150–400)
RBC: 5 MIL/uL (ref 4.22–5.81)
RDW: 13.2 % (ref 11.5–15.5)
WBC: 12.5 10*3/uL — ABNORMAL HIGH (ref 4.0–10.5)
nRBC: 0 % (ref 0.0–0.2)

## 2021-11-03 LAB — URINALYSIS, ROUTINE W REFLEX MICROSCOPIC
Bilirubin Urine: NEGATIVE
Glucose, UA: NEGATIVE mg/dL
Hgb urine dipstick: NEGATIVE
Ketones, ur: NEGATIVE mg/dL
Leukocytes,Ua: NEGATIVE
Nitrite: NEGATIVE
Protein, ur: NEGATIVE mg/dL
Specific Gravity, Urine: <=1.005 (ref 1.005–1.030)
pH: 6 (ref 5.0–8.0)

## 2021-11-03 LAB — LIPASE, BLOOD: Lipase: 44 U/L (ref 11–51)

## 2021-11-03 MED ORDER — METRONIDAZOLE 500 MG PO TABS: 500.0000 mg | ORAL_TABLET | Freq: Three times a day (TID) | ORAL | 0 refills | Status: AC

## 2021-11-03 MED ORDER — MORPHINE SULFATE (PF) 4 MG/ML IV SOLN
4.0000 mg | Freq: Once | INTRAVENOUS | Status: AC
  Administered 2021-11-03: 4 mg via INTRAVENOUS
  Filled 2021-11-03: qty 1

## 2021-11-03 MED ORDER — IOHEXOL 300 MG/ML  SOLN
100.0000 mL | Freq: Once | INTRAMUSCULAR | Status: AC | PRN
  Administered 2021-11-03: 100 mL via INTRAVENOUS

## 2021-11-03 MED ORDER — METRONIDAZOLE 500 MG PO TABS
500.0000 mg | ORAL_TABLET | Freq: Once | ORAL | Status: AC
  Administered 2021-11-03: 500 mg via ORAL
  Filled 2021-11-03: qty 1

## 2021-11-03 MED ORDER — AMOXICILLIN-POT CLAVULANATE 875-125 MG PO TABS: 1.0000 | ORAL_TABLET | Freq: Once | ORAL | Status: DC

## 2021-11-03 MED ORDER — OXYCODONE-ACETAMINOPHEN 5-325 MG PO TABS: 1.0000 | ORAL_TABLET | Freq: Four times a day (QID) | ORAL | 0 refills | Status: AC | PRN

## 2021-11-03 MED ORDER — CIPROFLOXACIN HCL 500 MG PO TABS
500.0000 mg | ORAL_TABLET | Freq: Once | ORAL | Status: AC
  Administered 2021-11-03: 500 mg via ORAL
  Filled 2021-11-03: qty 1

## 2021-11-03 MED ORDER — CIPROFLOXACIN HCL 500 MG PO TABS: 500.0000 mg | ORAL_TABLET | Freq: Two times a day (BID) | ORAL | 0 refills | Status: AC

## 2021-11-03 MED ORDER — LACTATED RINGERS IV BOLUS
1000.0000 mL | Freq: Once | INTRAVENOUS | Status: AC
  Administered 2021-11-03: 1000 mL via INTRAVENOUS

## 2021-11-03 MED ORDER — HYDROMORPHONE HCL 1 MG/ML IJ SOLN
0.5000 mg | Freq: Once | INTRAMUSCULAR | Status: DC
  Filled 2021-11-03 (×2): qty 1

## 2021-11-03 NOTE — Discharge Instructions (Addendum)
Please take antibiotics for the full course.  Make sure you are drinking plenty of water.  I recommend increasing your fiber intake and read the bowel regimen below.  Return the emergency room for fevers, worsening pain, vomiting or any other new or concerning symptoms  I gave you two tablets of percocet for breakthrough pain -- otherwise  Please use Tylenol or ibuprofen for pain.  You may use 600 mg ibuprofen every 6 hours or 1000 mg of Tylenol every 6 hours.  You may choose to alternate between the 2.  This would be most effective.  Not to exceed 4 g of Tylenol within 24 hours.  Not to exceed 3200 mg ibuprofen 24 hours.    GETTING TO GOOD BOWEL HEALTH. Irregular bowel habits such as constipation and diarrhea can lead to many problems over time.  Having one soft bowel movement a day is the most important way to prevent further problems.  The anorectal canal is designed to handle stretching and feces to safely manage our ability to get rid of solid waste (feces, poop, stool) out of our body.  BUT, hard constipated stools can act like ripping concrete bricks and diarrhea can be a burning fire to this very sensitive area of our body, causing inflamed hemorrhoids, anal fissures, increasing risk is perirectal abscesses, abdominal pain/bloating, an making irritable bowel worse.     The goal: ONE SOFT BOWEL MOVEMENT A DAY!  To have soft, regular bowel movements:  Drink at least 8 tall glasses of water a day.   Take plenty of fiber.  Fiber is the undigested part of plant food that passes into the colon, acting s "natures broom" to encourage bowel motility and movement.  Fiber can absorb and hold large amounts of water. This results in a larger, bulkier stool, which is soft and easier to pass. Work gradually over several weeks up to 6 servings a day of fiber (25g a day even more if needed) in the form of: Vegetables -- Root (potatoes, carrots, turnips), leafy green (lettuce, salad greens, celery, spinach),  or cooked high residue (cabbage, broccoli, etc) Fruit -- Fresh (unpeeled skin & pulp), Dried (prunes, apricots, cherries, etc ),  or stewed ( applesauce)  Whole grain breads, pasta, etc (whole wheat)  Bran cereals  Bulking Agents -- This type of water-retaining fiber generally is easily obtained each day by one of the following:  Psyllium bran -- The psyllium plant is remarkable because its ground seeds can retain so much water. This product is available as Metamucil, Konsyl, Effersyllium, Per Diem Fiber, or the less expensive generic preparation in drug and health food stores. Although labeled a laxative, it really is not a laxative.  Methylcellulose -- This is another fiber derived from wood which also retains water. It is available as Citrucel. Polyethylene Glycol - and "artificial" fiber commonly called Miralax or Glycolax.  It is helpful for people with gassy or bloated feelings with regular fiber Flax Seed - a less gassy fiber than psyllium No reading or other relaxing activity while on the toilet. If bowel movements take longer than 5 minutes, you are too constipated AVOID CONSTIPATION.  High fiber and water intake usually takes care of this.  Sometimes a laxative is needed to stimulate more frequent bowel movements, but  Laxatives are not a good long-term solution as it can wear the colon out. Osmotics (Milk of Magnesia, Fleets phosphosoda, Magnesium citrate, MiraLax, GoLytely) are safer than  Stimulants (Senokot, Castor Oil, Dulcolax, Ex Lax)  Do not take laxatives for more than 7days in a row.  IF SEVERELY CONSTIPATED, try a Bowel Retraining Program: Do not use laxatives.  Eat a diet high in roughage, such as bran cereals and leafy vegetables.  Drink six (6) ounces of prune or apricot juice each morning.  Eat two (2) large servings of stewed fruit each day.  Take one (1) heaping tablespoon of a psyllium-based bulking agent twice a day. Use sugar-free sweetener when possible to avoid  excessive calories.  Eat a normal breakfast.  Set aside 15 minutes after breakfast to sit on the toilet, but do not strain to have a bowel movement.  If you do not have a bowel movement by the third day, use an enema and repeat the above steps.  Controlling diarrhea Switch to liquids and simpler foods for a few days to avoid stressing your intestines further. Avoid dairy products (especially milk & ice cream) for a short time.  The intestines often can lose the ability to digest lactose when stressed. Avoid foods that cause gassiness or bloating.  Typical foods include beans and other legumes, cabbage, broccoli, and dairy foods.  Every person has some sensitivity to other foods, so listen to our body and avoid those foods that trigger problems for you. Adding fiber (Citrucel, Metamucil, psyllium, Miralax) gradually can help thicken stools by absorbing excess fluid and retrain the intestines to act more normally.  Slowly increase the dose over a few weeks.  Too much fiber too soon can backfire and cause cramping & bloating. Probiotics (such as active yogurt, Align, etc) may help repopulate the intestines and colon with normal bacteria and calm down a sensitive digestive tract.  Most studies show it to be of mild help, though, and such products can be costly. Medicines: Bismuth subsalicylate (ex. Kayopectate, Pepto Bismol) every 30 minutes for up to 6 doses can help control diarrhea.  Avoid if pregnant. Loperamide (Immodium) can slow down diarrhea.  Start with two tablets (4mg  total) first and then try one tablet every 6 hours.  Avoid if you are having fevers or severe pain.  If you are not better or start feeling worse, stop all medicines and call your doctor for advice Call your doctor if you are getting worse or not better.  Sometimes further testing (cultures, endoscopy, X-ray studies, bloodwork, etc) may be needed to help diagnose and treat the cause of the diarrhea.  Managing Pain  Pain after  surgery or related to activity is often due to strain/injury to muscle, tendon, nerves and/or incisions.  This pain is usually short-term and will improve in a few months.   Many people find it helpful to do the following things TOGETHER to help speed the process of healing and to get back to regular activity more quickly:  Avoid heavy physical activity  no lifting greater than 20 pounds Do not "push through" the pain.  Listen to your body and avoid positions and maneuvers than reproduce the pain Walking is okay as tolerated, but go slowly and stop when getting sore.  Remember: If it hurts to do it, then don't do it! Take Anti-inflammatory medication  Take with food/snack around the clock for 1-2 weeks This helps the muscle and nerve tissues become less irritable and calm down faster Choose ONE of the following over-the-counter medications: Naproxen 220mg  tabs (ex. Aleve) 1-2 pills twice a day  Ibuprofen 200mg  tabs (ex. Advil, Motrin) 3-4 pills with every meal and just before bedtime Acetaminophen 500mg  tabs (Tylenol) 1-2  pills with every meal and just before bedtime Use a Heating pad or Ice/Cold Pack 4-6 times a day May use warm bath/hottub  or showers Try Gentle Massage and/or Stretching  at the area of pain many times a day stop if you feel pain - do not overdo it  Try these steps together to help you body heal faster and avoid making things get worse.  Doing just one of these things may not be enough.    If you are not getting better after two weeks or are noticing you are getting worse, contact our office for further advice; we may need to re-evaluate you & see what other things we can do to help.

## 2021-11-03 NOTE — ED Provider Notes (Signed)
[facility] EMERGENCY DEPARTMENT Provider Note   CSN: [id number] Arrival date & time: 11/03/21  1531     History  Chief Complaint  Patient presents with   Abdominal Pain    RIDER ERMIS is a 51 y.o. male.   Abdominal Pain  Patient is a 51 year old male with a past medical history significant for diverticulitis, history of appendicitis status post appendectomy  He has been admitted to emergency room today with complaints of right lower quadrant abdominal pain.  He states that it has been progressively worsening over the past 2 days.  Denies any fever, diarrhea, urinary frequency urgency dysuria or hematuria.  Denies any chest pain or difficulty breathing.  No lightheadedness or dizziness.  He states that his symptoms feel worse than prior diverticulitis episodes.  He states his last episode of diverticulitis was several years ago.  He has not had any blood in his stool.      Home Medications Prior to Admission medications   Medication Sig Start Date End Date Taking? Authorizing Provider  cholecalciferol (VITAMIN D) 1000 units tablet Take 1,000 Units by mouth 3 (three) times a week.    [provider]  ciprofloxacin (CIPRO) 500 MG tablet Take 1 tablet (500 mg total) by mouth 2 (two) times daily. 05/08/16   Rise Mu, PA-C  clonazePAM (KLONOPIN) 0.5 MG tablet Take 0.5 mg by mouth 2 (two) times daily as needed for anxiety.    [provider]  docusate sodium (COLACE) 100 MG capsule Take 1 capsule (100 mg total) by mouth 2 (two) times daily. 05/04/16   Berna Bue, MD  fluticasone (FLONASE) 50 MCG/ACT nasal spray Place 1-2 sprays into both nostrils daily as needed for allergies or rhinitis.    [provider]  HYDROcodone-acetaminophen (NORCO/VICODIN) 5-325 MG tablet Take 2 tablets by mouth every 4 (four) hours as needed. 05/08/16   Rise Mu, PA-C  hydroxypropyl methylcellulose / hypromellose (ISOPTO TEARS / GONIOVISC)  2.5 % ophthalmic solution Place 1 drop into both eyes 3 (three) times daily as needed for dry eyes.    [provider]  meloxicam (MOBIC) 15 MG tablet Take 15 mg by mouth daily.    [provider]  metroNIDAZOLE (FLAGYL) 500 MG tablet Take 1 tablet (500 mg total) by mouth 2 (two) times daily with a meal. DO NOT CONSUME ALCOHOL WHILE TAKING THIS MEDICATION. 05/08/16   Rise Mu, PA-C  oxyCODONE-acetaminophen (PERCOCET/ROXICET) 5-325 MG tablet Take 1 tablet by mouth every 6 (six) hours as needed for severe pain. 05/04/16   Berna Bue, MD  testosterone cypionate (DEPOTESTOSTERONE CYPIONATE) 200 MG/ML injection Inject 200 mg into the muscle See admin instructions. Every 10 days    [provider]      Allergies    Amphetamine-dextroamphetamine and Lisdexamfetamine    Review of Systems   Review of Systems  Gastrointestinal:  Positive for abdominal pain.    Physical Exam Updated Vital Signs BP 115/66   Pulse 90   Temp 98.6 F (37 C) (Oral)   Resp 16   Ht 5\' 10"  (1.778 m)   Wt 86.2 kg   SpO2 99%   BMI 27.26 kg/m  Physical Exam Vitals and nursing note reviewed.  Constitutional:      General: He is not in acute distress. HENT:     Head: Normocephalic and atraumatic.     Nose: Nose normal.  Eyes:     General: No scleral icterus. Cardiovascular:  Rate and Rhythm: Normal rate and regular rhythm.     Pulses: Normal pulses.     Heart sounds: Normal heart sounds.  Pulmonary:     Effort: Pulmonary effort is normal. No respiratory distress.     Breath sounds: No wheezing.  Abdominal:     Palpations: Abdomen is soft.     Tenderness: There is abdominal tenderness in the right lower quadrant. There is no guarding. Negative signs include Murphy's sign, Rovsing's sign and McBurney's sign.  Musculoskeletal:     Cervical back: Normal range of motion.     Right lower leg: No edema.     Left lower leg: No edema.  Skin:    General: Skin is warm and  dry.     Capillary Refill: Capillary refill takes less than 2 seconds.  Neurological:     Mental Status: He is alert. Mental status is at baseline.  Psychiatric:        Mood and Affect: Mood normal.        Behavior: Behavior normal.     ED Results / Procedures / Treatments   Labs (all labs ordered are listed, but only abnormal results are displayed) Labs Reviewed  COMPREHENSIVE METABOLIC PANEL - Abnormal; Notable for the following components:      Result Value   Alkaline Phosphatase 36 (*)    Total Bilirubin 2.9 (*)    All other components within normal limits  CBC WITH DIFFERENTIAL/PLATELET - Abnormal; Notable for the following components:   WBC 12.5 (*)    Neutro Abs 9.6 (*)    Monocytes Absolute 1.3 (*)    All other components within normal limits  LIPASE, BLOOD  URINALYSIS, ROUTINE W REFLEX MICROSCOPIC    EKG None  Radiology CT Abdomen Pelvis W Contrast  Result Date: 11/03/2021 CLINICAL DATA:  RLQ abdominal pain (Age >= 14y) LLQ pain w hx of divertic --> however TTP in RLQ (hx of appendicitis) EXAM: CT ABDOMEN AND PELVIS WITH CONTRAST TECHNIQUE: Multidetector CT imaging of the abdomen and pelvis was performed using the standard protocol following bolus administration of intravenous contrast. RADIATION DOSE REDUCTION: This exam was performed according to the departmental dose-optimization program which includes automated exposure control, adjustment of the mA and/or kV according to patient size and/or use of iterative reconstruction technique. CONTRAST:  148mL OMNIPAQUE IOHEXOL 300 MG/ML  SOLN COMPARISON:  Most recent CT 05/08/2016 FINDINGS: Lower chest: Clear lung bases. Hepatobiliary: Mild hepatic steatosis. No focal liver abnormality. Gallbladder physiologically distended, no calcified stone. No biliary dilatation. Pancreas: No ductal dilatation or inflammation. Spleen: Normal in size without focal abnormality. Splenule posteriorly. Adrenals/Urinary Tract: Normal adrenal  glands. No hydronephrosis or perinephric edema. Homogeneous renal enhancement with symmetric excretion on delayed phase imaging. No renal calculi or focal renal abnormality. Urinary bladder is physiologically distended without wall thickening. Stomach/Bowel: Inflamed diverticulum in the ascending colon with adjacent pericolonic edema and fat stranding. Inflammatory changes extend to the anterior right pararenal fossa. There is no perforation or abscess. Innumerable additional noninflamed colonic diverticula are seen throughout the colon without additional sites of diverticulitis. Prior appendectomy. Unremarkable appearance of the stomach. No small bowel obstruction or inflammation. Vascular/Lymphatic: Aortic atherosclerosis. No aneurysm. Patent portal and splenic veins. No enlarged abdominopelvic lymph nodes. Slight increased number of tiny pericolonic nodes in the region of inflammation measuring up to 5 mm, likely reactive. Reproductive: Prostate is unremarkable. Other: Inflammatory changes in the right abdomen related to ascending diverticulitis. No free air or perforation. No abscess. Minimal fat in the  inguinal canals without significant hernia. Musculoskeletal: There are no acute or suspicious osseous abnormalities. Scattered lumbar spondylosis. IMPRESSION: 1. Acute uncomplicated diverticulitis of the ascending colon. No perforation or abscess. 2. Mild hepatic steatosis. Aortic Atherosclerosis (ICD10-I70.0). Electronically Signed   By: Narda Rutherford M.D.   On: 11/03/2021 17:51    Procedures Procedures    Medications Ordered in ED Medications  HYDROmorphone (DILAUDID) injection 0.5 mg (0.5 mg Intravenous Not Given 11/03/21 1836)  lactated ringers bolus 1,000 mL (0 mLs Intravenous Stopped 11/03/21 1757)  morphine (PF) 4 MG/ML injection 4 mg (4 mg Intravenous Given 11/03/21 1637)  iohexol (OMNIPAQUE) 300 MG/ML solution 100 mL (100 mLs Intravenous Contrast Given 11/03/21 1722)  ciprofloxacin (CIPRO)  tablet 500 mg (500 mg Oral Given 11/03/21 1836)  metroNIDAZOLE (FLAGYL) tablet 500 mg (500 mg Oral Given 11/03/21 1836)    ED Course/ Medical Decision Making/ A&P Clinical Course as of 11/03/21 1910  Wed Nov 03, 2021  1829 [facility] [WF]    Clinical Course User Index [WF] Gailen Shelter, [place]                           Medical Decision Making Amount and/or Complexity of Data Reviewed Labs: ordered. Radiology: ordered.  Risk Prescription drug management.   This patient presents to the ED for concern of abdominal pain, this involves a number of treatment options, and is a complaint that carries with it a moderate to high risk of complications and morbidity. A differential diagnosis was considered for the patient's symptoms which is discussed below:   The causes of generalized abdominal pain include but are not limited to AAA, mesenteric ischemia, appendicitis, diverticulitis, DKA, gastritis, gastroenteritis, AMI, nephrolithiasis, pancreatitis, peritonitis, adrenal insufficiency,lead poisoning, iron toxicity, intestinal ischemia, constipation, UTI,SBO/LBO, splenic rupture, biliary disease, IBD, IBS, PUD, or hepatitis.   Co morbidities: Discussed in HPI   Brief History:  Patient is a 51 year old male with a past medical history significant for diverticulitis, history of appendicitis status post appendectomy  He has been admitted to emergency room today with complaints of right lower quadrant abdominal pain.  He states that it has been progressively worsening over the past 2 days.  Denies any fever, diarrhea, urinary frequency urgency dysuria or hematuria.  Denies any chest pain or difficulty breathing.  No lightheadedness or dizziness.  He states that his symptoms feel worse than prior diverticulitis episodes.  He states his last episode of diverticulitis was several years ago.  He has not had any blood in his stool.  Physical exam with significant right lower quadrant  tenderness.  EMR reviewed including pt PMHx, past surgical history and past visits to ER.   See HPI for more details   Lab Tests:   I ordered and independently interpreted labs. Labs notable for leukocytosis with left shift, CMP unremarkable lipase within normal meds and urinalysis unremarkable.  Given leukocytosis and abdominal tenderness will obtain the abdomen/pelvis   Imaging Studies:  Abnormal findings. I personally reviewed all imaging studies. Imaging notable for  IMPRESSION:  1. Acute uncomplicated diverticulitis of the ascending colon. No  perforation or abscess.  2. Mild hepatic steatosis.    Aortic Atherosclerosis (ICD10-I70.0).      Electronically Signed    By: Narda Rutherford M.D.    On: 11/03/2021 17:51    Cardiac Monitoring:  The patient was maintained on a cardiac monitor.  I personally viewed and interpreted the cardiac monitored which showed an underlying rhythm of:  NSR NA   Medicines ordered:  I ordered medication including morphine, LR 1 L, ciprofloxacin and metronidazole for diverticulitis Reevaluation of the patient after these medicines showed that the patient improved I have reviewed the patients home medicines and have made adjustments as needed   Critical Interventions:     Consults/Attending Physician      Reevaluation:  After the interventions noted above I re-evaluated patient and found that they have :improved   Social Determinants of Health:      Problem List / ED Course:  Acute uncomplicated diverticulitis.  Treated with ciprofloxacin Flagyl given his preference for this antibiotic regimen.  Return precautions discussed.  Otherwise he will follow-up with PCP.   Dispostion:  After consideration of the diagnostic results and the patients response to treatment, I feel that the patent would benefit from outpatient follow-up.  Return precautions were discussed  Final Clinical Impression(s) / ED Diagnoses Final  diagnoses:  None    Rx / DC Orders ED Discharge Orders     None         Gailen Shelter, [place] 11/03/21 1910    Charlynne Pander, MD 11/03/21 2144

## 2021-11-03 NOTE — ED Triage Notes (Signed)
Constant left sided abdominal pain since Monday. Tender upon palpation. Hx of diverticulitis.
